# Patient Record
Sex: Female | Born: 1990 | Race: White | Hispanic: No | Marital: Married | Smoking: Never smoker
Health system: Southern US, Community
[De-identification: ages and names within clinical notes are randomized; demographics above are authoritative.]

## PROBLEM LIST (undated history)

## (undated) DIAGNOSIS — F32A Depression, unspecified: Secondary | ICD-10-CM

## (undated) HISTORY — DX: Depression, unspecified: F32.A

---
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
## 2019-12-13 NOTE — L&D Delivery Note (Signed)
OB/GYN Faculty Practice Delivery Note  Paige Michael is a 29 y.o. [id number] s/p spontaneous vaginal delivery at [redacted]w[redacted]d. She was admitted for SROM.   ROM: 12h 56m with clear fluid GBS Status: Negative Maximum Maternal Temperature: 97.9 F   Labor Progress: . Presented to MAU for admission s/p SROM at 0200. Patient desired a waterbirth and entered the tub at 0900 until 1330. Patient encouraged to get out of the tub to void. Patient desired to labor on the bed for a while.  Peanut ball placed for 35 minutes. Patient progressed to complete dilatation without augmentation. Labor progress unremarkable.  Delivery Date/Time: 11/30/2020 at 1408 Delivery: Called to room and patient was complete and pushing. Head delivered ROA. No nuchal cord present. Shoulder and body delivered in usual fashion. Infant with spontaneous cry, placed on mother's abdomen, dried and stimulated. Cord clamped x 2 after 1-minute delay, and cut by patient. Cord blood drawn. Placenta delivered spontaneously, intact, with 3-vessel cord. Fundus firm with massage. Labia, perineum, vagina, and cervix inspected, no laceration found.   Placenta: spontaneous, intact, 3VC at 7034 Complications: none Lacerations: none EBL: 50 mL Analgesia: none  Postpartum Planning [x]  transfer orders to MB [x]  discharge summary started & shared [x]  message to sent to schedule follow-up  [x]  lists updated [x]  vaccines UTD  Infant: girl  APGARs 6/8  [place], [place] for [place] 11/30/2020, 2:26 PM

## 2020-04-16 LAB — OB RESULTS CONSOLE GC/CHLAMYDIA
Chlamydia: NEGATIVE
Gonorrhea: NEGATIVE

## 2020-04-16 LAB — CYTOLOGY - PAP: Pap: NEGATIVE

## 2020-05-14 LAB — OB RESULTS CONSOLE RUBELLA ANTIBODY, IGM: Rubella: IMMUNE

## 2020-05-14 LAB — OB RESULTS CONSOLE HGB/HCT, BLOOD
HCT: 38 (ref 29–41)
Hemoglobin: 12.4

## 2020-05-14 LAB — OB RESULTS CONSOLE HIV ANTIBODY (ROUTINE TESTING): HIV: NONREACTIVE

## 2020-05-14 LAB — OB RESULTS CONSOLE HEPATITIS B SURFACE ANTIGEN: Hepatitis B Surface Ag: NEGATIVE

## 2020-05-14 LAB — OB RESULTS CONSOLE ANTIBODY SCREEN: Antibody Screen: NEGATIVE

## 2020-05-14 LAB — OB RESULTS CONSOLE RPR: RPR: NONREACTIVE

## 2020-05-14 LAB — HEMOGLOBIN A1C: Hemoglobin A1C: 4.9

## 2020-05-14 LAB — OB RESULTS CONSOLE ABO/RH: RH Type: POSITIVE

## 2020-05-14 LAB — OB RESULTS CONSOLE PLATELET COUNT: Platelets: 260

## 2020-09-11 LAB — OB RESULTS CONSOLE HGB/HCT, BLOOD
HCT: 31 (ref 29–41)
Hemoglobin: 10

## 2020-09-11 LAB — OB RESULTS CONSOLE RPR: RPR: NONREACTIVE

## 2020-09-11 LAB — OB RESULTS CONSOLE PLATELET COUNT: Platelets: 286

## 2020-09-11 LAB — GLUCOSE TOLERANCE, 1 HOUR: Glucose, 1 Hour GTT: 127

## 2020-09-14 ENCOUNTER — Inpatient Hospital Stay (HOSPITAL_COMMUNITY): Admit: 2020-09-14 | Payer: No Typology Code available for payment source | Admitting: Obstetrics

## 2020-10-14 ENCOUNTER — Encounter: Payer: Self-pay | Admitting: General Practice

## 2020-10-22 ENCOUNTER — Encounter: Payer: Self-pay | Admitting: Obstetrics and Gynecology

## 2020-10-22 ENCOUNTER — Other Ambulatory Visit: Payer: Self-pay

## 2020-10-22 ENCOUNTER — Ambulatory Visit (INDEPENDENT_AMBULATORY_CARE_PROVIDER_SITE_OTHER): Payer: No Typology Code available for payment source | Admitting: Obstetrics and Gynecology

## 2020-10-22 DIAGNOSIS — Z3480 Encounter for supervision of other normal pregnancy, unspecified trimester: Secondary | ICD-10-CM | POA: Insufficient documentation

## 2020-10-22 HISTORY — DX: Encounter for supervision of other normal pregnancy, unspecified trimester: Z34.80

## 2020-10-22 NOTE — Patient Instructions (Signed)
Considering Waterbirth? Guide for patients at [facility] Why consider waterbirth? . Gentle birth for babies  . Less pain medicine used in labor  . May allow for passive descent/less pushing  . May reduce perineal tears  . More mobility and instinctive maternal position changes  . Increased maternal relaxation  . Reduced blood pressure in labor   Is waterbirth safe? What are the risks of infection, drowning or other complications? . Infection:  Marland Kitchen Very low risk (3.7 % for tub vs 4.8% for bed)  . 7 in 65 waterbirths with documented infection  . Poorly cleaned equipment most common cause  . Slightly lower group B strep transmission rate  . Drowning  . Maternal:  . Very low risk  . Related to seizures or fainting  . Newborn:  Marland Kitchen Very low risk. No evidence of increased risk of respiratory problems in multiple large studies  . Physiological protection from breathing under water  . Avoid underwater birth if there are any fetal complications  . Once baby's head is out of the water, keep it out.  . Birth complication  . Some reports of cord trauma, but risk decreased by bringing baby to surface gradually  . No evidence of increased risk of shoulder dystocia. Mothers can usually change positions faster in water than in a bed, possibly aiding the maneuvers to free the shoulder.  ? You must attend a Waterbirth class at [facility] . 3rd Wednesday of every month from 7-9pm  . Free  . Register by calling [phone] or online at [email or web address]  . Bring [place] the certificate from the class to your prenatal appointment  Meet with a midwife at 36 weeks to see if you can still plan a waterbirth and to sign the consent.   If you plan a waterbirth at [facility], the following purchases are optional: . [place] . Bathing suit top  . Long-handled mirror  .  Things that would prevent you from having a  waterbirth: . Unknown or Positive COVID-19 diagnosis upon admission to hospital  . Premature, <37wks  . Previous cesarean birth  . Presence of thick meconium-stained fluid  . Multiple gestation (Twins, triplets, etc.)  . Uncontrolled diabetes or gestational diabetes requiring medication  . Hypertension diagnosed in pregnancy or preexisting hypertension (gestational hypertension, preeclampsia, or chronic hypertension) . Heavy vaginal bleeding  . Non-reassuring fetal heart rate  . Active infection (MRSA, etc.). Group B Strep is NOT a contraindication for waterbirth.  . If your labor has to be induced and induction method requires continuous monitoring of the baby's heart rate  . Other risks/issues identified by your obstetrical provider  .  Please remember that birth is unpredictable. Under certain unforeseeable circumstances your provider may advise against giving birth in the tub. These decisions will be made on a case-by-case basis and with the safety of you and your baby as our highest priority.  **Please remember that in order to have a waterbirth, you must test Negative to COVID-19 upon admission to the hospital.**

## 2020-10-22 NOTE — Progress Notes (Signed)
   PRENATAL VISIT NOTE  Subjective:  Janissa Bertram is a 29 y.o. G3P2002 at [redacted]w[redacted]d being seen today for ongoing prenatal care. Patient is transferring care from [facility] with records. Patient reports uncomplicated 2 previous vaginal deliveries with minimal interventions. She transferred care to our group with the hope of having a similar experience.  She is currently monitored for the following issues for this low-risk pregnancy and has Supervision of other normal pregnancy, antepartum on their problem list.  Patient reports no complaints.  Contractions: Irritability. Vag. Bleeding: None.  Movement: Present. Denies leaking of fluid.   The following portions of the patient's history were reviewed and updated as appropriate: allergies, current medications, past family history, past medical history, past social history, past surgical history and problem list.   Objective:   Vitals:   10/22/20 1443  BP: 104/67  Pulse: 89    Fetal Status: Fetal Heart Rate (bpm): 140 Fundal Height: 34 cm Movement: Present     General:  Alert, oriented and cooperative. Patient is in no acute distress.  Skin: Skin is warm and dry. No rash noted.   Cardiovascular: Normal heart rate noted  Respiratory: Normal respiratory effort, no problems with respiration noted  Abdomen: Soft, gravid, appropriate for gestational age.  Pain/Pressure: Present     Pelvic: Cervical exam deferred        Extremities: Normal range of motion.  Edema: None  Mental Status: Normal mood and affect. Normal behavior. Normal judgment and thought content.   Assessment and Plan:  Pregnancy: G3P2002 at [redacted]w[redacted]d 1. Supervision of other normal pregnancy, antepartum Patient is doing well without complaints Patient is very much interested in waterbirth and transferred care from [facility] next visit  Midwife visit  Preterm labor symptoms and general obstetric precautions including but not limited to vaginal bleeding,  contractions, leaking of fluid and fetal movement were reviewed in detail with the patient. Please refer to After Visit Summary for other counseling recommendations.   Return in about 2 weeks (around 11/05/2020) for in person, ROB/GBS, Low risk- with a midwife for waterbirth.  No future appointments.  Mora Bellman, MD

## 2020-11-03 ENCOUNTER — Encounter: Payer: Self-pay | Admitting: Advanced Practice Midwife

## 2020-11-03 ENCOUNTER — Other Ambulatory Visit (HOSPITAL_COMMUNITY)
Admission: RE | Admit: 2020-11-03 | Discharge: 2020-11-03 | Disposition: A | Discharge status: Home / Self Care | Payer: No Typology Code available for payment source | Source: Ambulatory Visit | Attending: Advanced Practice Midwife | Admitting: Advanced Practice Midwife

## 2020-11-03 ENCOUNTER — Ambulatory Visit (INDEPENDENT_AMBULATORY_CARE_PROVIDER_SITE_OTHER): Payer: No Typology Code available for payment source | Admitting: Advanced Practice Midwife

## 2020-11-03 ENCOUNTER — Other Ambulatory Visit: Payer: Self-pay

## 2020-11-03 VITALS — BP 140/80 | HR 88 | Wt 203.9 lb

## 2020-11-03 DIAGNOSIS — Z3A36 36 weeks gestation of pregnancy: Secondary | ICD-10-CM

## 2020-11-03 DIAGNOSIS — Z3480 Encounter for supervision of other normal pregnancy, unspecified trimester: Secondary | ICD-10-CM

## 2020-11-03 NOTE — Progress Notes (Signed)
° °  PRENATAL VISIT NOTE  Subjective:  Paige Michael is a 29 y.o. G3P2002 at [redacted]w[redacted]d being seen today for ongoing prenatal care.  She is currently monitored for the following issues for this low-risk pregnancy and has Supervision of other normal pregnancy, antepartum on their problem list.  Patient reports no complaints.  Contractions: Irritability. Vag. Bleeding: None.  Movement: Present. Denies leaking of fluid.   The following portions of the patient's history were reviewed and updated as appropriate: allergies, current medications, past family history, past medical history, past social history, past surgical history and problem list.   Objective:   Vitals:   11/03/20 1548  BP: 140/80  Pulse: 88  Weight: 203 lb 14.4 oz (92.5 kg)    Fetal Status: Fetal Heart Rate (bpm): 141 Fundal Height: 36 cm Movement: Present     General:  Alert, oriented and cooperative. Patient is in no acute distress.  Skin: Skin is warm and dry. No rash noted.   Cardiovascular: Normal heart rate noted  Respiratory: Normal respiratory effort, no problems with respiration noted  Abdomen: Soft, gravid, appropriate for gestational age.  Pain/Pressure: Absent     Pelvic: Cervical exam performed in the presence of a chaperone Dilation: Closed Effacement (%): Thick Station: Ballotable  Extremities: Normal range of motion.  Edema: None  Mental Status: Normal mood and affect. Normal behavior. Normal judgment and thought content.   Assessment and Plan:  Pregnancy: G3P2002 at [redacted]w[redacted]d 1. Supervision of other normal pregnancy, antepartum - GC/Chlamydia probe amp ([place])not at [facility] - Culture, beta strep (group b only)  2. [redacted] weeks gestation of pregnancy - Planning water birth - Has class scheduled for 11/11/2020 - Has gone post-dates with prior pregnancies   Term labor symptoms and general obstetric precautions including but not limited to vaginal bleeding, contractions, leaking of fluid and fetal movement were  reviewed in detail with the patient. Please refer to After Visit Summary for other counseling recommendations.   Return in about 1 week (around 11/10/2020).  No future appointments.  Marcille Buffy DNP, CNM  11/03/20  4:27 PM

## 2020-11-03 NOTE — Patient Instructions (Signed)
Considering Waterbirth? Guide for patients at [facility]) Why consider waterbirth? . Gentle birth for babies  . Less pain medicine used in labor  . May allow for passive descent/less pushing  . May reduce perineal tears  . More mobility and instinctive maternal position changes  . Increased maternal relaxation   Is waterbirth safe? What are the risks of infection, drowning or other complications? . Infection:  Marland Kitchen Very low risk (3.7 % for tub vs 4.8% for bed)  . 7 in 27 waterbirths with documented infection  . Poorly cleaned equipment most common cause  . Slightly lower group B strep transmission rate  . Drowning  . Maternal:  . Very low risk  . Related to seizures or fainting  . Newborn:  Marland Kitchen Very low risk. No evidence of increased risk of respiratory problems in multiple large studies  . Physiological protection from breathing under water  . Avoid underwater birth if there are any fetal complications  . Once baby's head is out of the water, keep it out.  . Birth complication  . Some reports of cord trauma, but risk decreased by bringing baby to surface gradually  . No evidence of increased risk of shoulder dystocia. Mothers can usually change positions faster in water than in a bed, possibly aiding the maneuvers to free the shoulder.   There are 2 things you MUST do to have a waterbirth with [facility]: 1. Attend a waterbirth class at [place] [facility]   a. 3rd Wednesday of every month from 7-9 pm (virtual during [place]) b. Free c. Register by calling [phone] or register online at [email or web address] d. Bring [place] the certificate from the class to your prenatal appointment or send via MyChart 2. Meet with a midwife at 36 weeks* to see if you can still plan a waterbirth and to sign the consent.   *We also recommend that you schedule as many of your prenatal visits with a midwife as possible.    Helpful information: . You may  want to bring a bathing suit top to the hospital to wear during labor but this is optional.  All other supplies are provided by the hospital. . Please arrive at the hospital with signs of active labor, and do not wait at home until late in labor. It takes 45 min- 2 hours for COVID testing, fetal monitoring, and check in to your room to take place, plus transport and filling of the waterbirth tub.    Things that would prevent you from having a waterbirth: . Unknown or Positive COVID-19 diagnosis upon admission to hospital* . Premature, <37wks  . Previous cesarean birth  . Presence of thick meconium-stained fluid  . Multiple gestation (Twins, triplets, etc.)  . Uncontrolled diabetes or gestational diabetes requiring medication  . Hypertension diagnosed in pregnancy or preexisting hypertension (gestational hypertension, preeclampsia, or chronic hypertension) . Heavy vaginal bleeding  . Non-reassuring fetal heart rate  . Active infection (MRSA, etc.). Group B Strep is NOT a contraindication for waterbirth.  . If your labor has to be induced and induction method requires continuous monitoring of the baby's heart rate  . Other risks/issues identified by your obstetrical provider   Please remember that birth is unpredictable. Under certain unforeseeable circumstances your provider may advise against giving birth in the tub. These decisions will be made on a case-by-case basis and with the safety of you and your baby as our highest priority.   *Please remember that in order  to have a waterbirth, you must test Negative to COVID-19 upon admission to the hospital.  Updated 10/27/2020

## 2020-11-04 LAB — GC/CHLAMYDIA PROBE AMP (~~LOC~~) NOT AT ARMC
Chlamydia: NEGATIVE
Comment: NEGATIVE
Comment: NORMAL
Neisseria Gonorrhea: NEGATIVE

## 2020-11-07 LAB — CULTURE, BETA STREP (GROUP B ONLY): Strep Gp B Culture: NEGATIVE

## 2020-11-10 ENCOUNTER — Other Ambulatory Visit: Payer: Self-pay

## 2020-11-10 ENCOUNTER — Ambulatory Visit (INDEPENDENT_AMBULATORY_CARE_PROVIDER_SITE_OTHER): Payer: No Typology Code available for payment source | Admitting: Family Medicine

## 2020-11-10 ENCOUNTER — Encounter: Payer: Self-pay | Admitting: Family Medicine

## 2020-11-10 DIAGNOSIS — Z3480 Encounter for supervision of other normal pregnancy, unspecified trimester: Secondary | ICD-10-CM

## 2020-11-10 NOTE — Progress Notes (Signed)
   Subjective:  Paige Michael is a 29 y.o. G3P2002 at [redacted]w[redacted]d being seen today for ongoing prenatal care.  She is currently monitored for the following issues for this low-risk pregnancy and has Supervision of other normal pregnancy, antepartum on their problem list.  Patient reports mild congestion.  Contractions: Irritability. Vag. Bleeding: None.  Movement: Present. Denies leaking of fluid.   The following portions of the patient's history were reviewed and updated as appropriate: allergies, current medications, past family history, past medical history, past social history, past surgical history and problem list. Problem list updated.  Objective:   Vitals:   11/10/20 0909 11/10/20 0914  BP: 121/79   Pulse: (!) 124   Weight: 204 lb 8 oz (92.8 kg)   Height:  5' 3.5" (1.613 m)    Fetal Status: Fetal Heart Rate (bpm): 142   Movement: Present     General:  Alert, oriented and cooperative. Patient is in no acute distress.  Skin: Skin is warm and dry. No rash noted.   Cardiovascular: Normal heart rate noted  Respiratory: Normal respiratory effort, no problems with respiration noted  Abdomen: Soft, gravid, appropriate for gestational age. Pain/Pressure: Present     Pelvic: Vag. Bleeding: None     Cervical exam deferred        Extremities: Normal range of motion.  Edema: Trace  Mental Status: Normal mood and affect. Normal behavior. Normal judgment and thought content.   Urinalysis:      Assessment and Plan:  Pregnancy: G3P2002 at [redacted]w[redacted]d  1. 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 Please refer to After Visit Summary for other counseling recommendations.  Return in 1 week (on 11/17/2020) for [facility], ob visit.   Clarnce Flock, MD

## 2020-11-10 NOTE — Patient Instructions (Signed)
Third Trimester of Pregnancy The third trimester is from week 28 through week 40 (months 7 through 9). The third trimester is a time when the unborn baby (fetus) is growing rapidly. At the end of the ninth month, the fetus is about 20 inches in length and weighs 6-10 pounds. Body changes during your third trimester Your body will continue to go through many changes during pregnancy. The changes vary from woman to woman. During the third trimester:  Your weight will continue to increase. You can expect to gain 25-35 pounds (11-16 kg) by the end of the pregnancy.  You may begin to get stretch marks on your hips, abdomen, and breasts.  You may urinate more often because the fetus is moving lower into your pelvis and pressing on your bladder.  You may develop or continue to have heartburn. This is caused by increased hormones that slow down muscles in the digestive tract.  You may develop or continue to have constipation because increased hormones slow digestion and cause the muscles that push waste through your intestines to relax.  You may develop hemorrhoids. These are swollen veins (varicose veins) in the rectum that can itch or be painful.  You may develop swollen, bulging veins (varicose veins) in your legs.  You may have increased body aches in the pelvis, back, or thighs. This is due to weight gain and increased hormones that are relaxing your joints.  You may have changes in your hair. These can include thickening of your hair, rapid growth, and changes in texture. Some women also have hair loss during or after pregnancy, or hair that feels dry or thin. Your hair will most likely return to normal after your baby is born.  Your breasts will continue to grow and they will continue to become tender. A yellow fluid (colostrum) may leak from your breasts. This is the first milk you are producing for your baby.  Your belly button may stick out.  You may notice more swelling in your  hands, face, or ankles.  You may have increased tingling or numbness in your hands, arms, and legs. The skin on your belly may also feel numb.  You may feel short of breath because of your expanding uterus.  You may have more problems sleeping. This can be caused by the size of your belly, increased need to urinate, and an increase in your body's metabolism.  You may notice the fetus "dropping," or moving lower in your abdomen (lightening).  You may have increased vaginal discharge.  You may notice your joints feel loose and you may have pain around your pelvic bone. What to expect at prenatal visits You will have prenatal exams every 2 weeks until week 36. Then you will have weekly prenatal exams. During a routine prenatal visit:  You will be weighed to make sure you and the baby are growing normally.  Your blood pressure will be taken.  Your abdomen will be measured to track your baby's growth.  The fetal heartbeat will be listened to.  Any test results from the previous visit will be discussed.  You may have a cervical check near your due date to see if your cervix has softened or thinned (effaced).  You will be tested for Group B streptococcus. This happens between 35 and 37 weeks. Your health care provider may ask you:  What your birth plan is.  How you are feeling.  If you are feeling the baby move.  If you have had any  abnormal symptoms, such as leaking fluid, bleeding, severe headaches, or abdominal cramping.  If you are using any tobacco products, including cigarettes, chewing tobacco, and electronic cigarettes.  If you have any questions. Other tests or screenings that may be performed during your third trimester include:  Blood tests that check for low iron levels (anemia).  Fetal testing to check the health, activity level, and growth of the fetus. Testing is done if you have certain medical conditions or if there are problems during the  pregnancy.  Nonstress test (NST). This test checks the health of your baby to make sure there are no signs of problems, such as the baby not getting enough oxygen. During this test, a belt is placed around your belly. The baby is made to move, and its heart rate is monitored during movement. What is false labor? False labor is a condition in which you feel small, irregular tightenings of the muscles in the womb (contractions) that usually go away with rest, changing position, or drinking water. These are called Braxton Hicks contractions. Contractions may last for hours, days, or even weeks before true labor sets in. If contractions come at regular intervals, become more frequent, increase in intensity, or become painful, you should see your health care provider. What are the signs of labor?  Abdominal cramps.  Regular contractions that start at 10 minutes apart and become stronger and more frequent with time.  Contractions that start on the top of the uterus and spread down to the lower abdomen and back.  Increased pelvic pressure and dull back pain.  A watery or bloody mucus discharge that comes from the vagina.  Leaking of amniotic fluid. This is also known as your "water breaking." It could be a slow trickle or a gush. Let your health care provider know if it has a color or strange odor. If you have any of these signs, call your health care provider right away, even if it is before your due date. Follow these instructions at home: Medicines  Follow your health care provider's instructions regarding medicine use. Specific medicines may be either safe or unsafe to take during pregnancy.  Take a prenatal vitamin that contains at least 600 micrograms (mcg) of folic acid.  If you develop constipation, try taking a stool softener if your health care provider approves. Eating and drinking   Eat a balanced diet that includes fresh fruits and vegetables, whole grains, good sources of protein  such as meat, eggs, or tofu, and low-fat dairy. Your health care provider will help you determine the amount of weight gain that is right for you.  Avoid raw meat and uncooked cheese. These carry germs that can cause birth defects in the baby.  If you have low calcium intake from food, talk to your health care provider about whether you should take a daily calcium supplement.  Eat four or five small meals rather than three large meals a day.  Limit foods that are high in fat and processed sugars, such as fried and sweet foods.  To prevent constipation: ? Drink enough fluid to keep your urine clear or pale yellow. ? Eat foods that are high in fiber, such as fresh fruits and vegetables, whole grains, and beans. Activity  Exercise only as directed by your health care provider. Most women can continue their usual exercise routine during pregnancy. Try to exercise for 30 minutes at least 5 days a week. Stop exercising if you experience uterine contractions.  Avoid heavy lifting.  Do not exercise in extreme heat or humidity, or at high altitudes.  Wear low-heel, comfortable shoes.  Practice good posture.  You may continue to have sex unless your health care provider tells you otherwise. Relieving pain and discomfort  Take frequent breaks and rest with your legs elevated if you have leg cramps or low back pain.  Take warm sitz baths to soothe any pain or discomfort caused by hemorrhoids. Use hemorrhoid cream if your health care provider approves.  Wear a good support bra to prevent discomfort from breast tenderness.  If you develop varicose veins: ? Wear support pantyhose or compression stockings as told by your healthcare provider. ? Elevate your feet for 15 minutes, 3-4 times a day. Prenatal care  Write down your questions. Take them to your prenatal visits.  Keep all your prenatal visits as told by your health care provider. This is important. Safety  Wear your seat belt at  all times when driving.  Make a list of emergency phone numbers, including numbers for family, friends, the hospital, and police and fire departments. General instructions  Avoid cat litter boxes and soil used by cats. These carry germs that can cause birth defects in the baby. If you have a cat, ask someone to clean the litter box for you.  Do not travel far distances unless it is absolutely necessary and only with the approval of your health care provider.  Do not use hot tubs, steam rooms, or saunas.  Do not drink alcohol.  Do not use any products that contain nicotine or tobacco, such as cigarettes and e-cigarettes. If you need help quitting, ask your health care provider.  Do not use any medicinal herbs or unprescribed drugs. These chemicals affect the formation and growth of the baby.  Do not douche or use tampons or scented sanitary pads.  Do not cross your legs for long periods of time.  To prepare for the arrival of your baby: ? Take prenatal classes to understand, practice, and ask questions about labor and delivery. ? Make a trial run to the hospital. ? Visit the hospital and tour the maternity area. ? Arrange for maternity or paternity leave through employers. ? Arrange for family and friends to take care of pets while you are in the hospital. ? Purchase a rear-facing car seat and make sure you know how to install it in your car. ? Pack your hospital bag. ? Prepare the baby's nursery. Make sure to remove all pillows and stuffed animals from the baby's crib to prevent suffocation.  Visit your dentist if you have not gone during your pregnancy. Use a soft toothbrush to brush your teeth and be gentle when you floss. Contact a health care provider if:  You are unsure if you are in labor or if your water has broken.  You become dizzy.  You have mild pelvic cramps, pelvic pressure, or nagging pain in your abdominal area.  You have lower back pain.  You have persistent  nausea, vomiting, or diarrhea.  You have an unusual or bad smelling vaginal discharge.  You have pain when you urinate. Get help right away if:  Your water breaks before 37 weeks.  You have regular contractions less than 5 minutes apart before 37 weeks.  You have a fever.  You are leaking fluid from your vagina.  You have spotting or bleeding from your vagina.  You have severe abdominal pain or cramping.  You have rapid weight loss or weight gain.  You  have shortness of breath with chest pain.  You notice sudden or extreme swelling of your face, hands, ankles, feet, or legs.  Your baby makes fewer than 10 movements in 2 hours.  You have severe headaches that do not go away when you take medicine.  You have vision changes. Summary  The third trimester is from week 28 through week 40, months 7 through 9. The third trimester is a time when the unborn baby (fetus) is growing rapidly.  During the third trimester, your discomfort may increase as you and your baby continue to gain weight. You may have abdominal, leg, and back pain, sleeping problems, and an increased need to urinate.  During the third trimester your breasts will keep growing and they will continue to become tender. A yellow fluid (colostrum) may leak from your breasts. This is the first milk you are producing for your baby.  False labor is a condition in which you feel small, irregular tightenings of the muscles in the womb (contractions) that eventually go away. These are called Braxton Hicks contractions. Contractions may last for hours, days, or even weeks before true labor sets in.  Signs of labor can include: abdominal cramps; regular contractions that start at 10 minutes apart and become stronger and more frequent with time; watery or bloody mucus discharge that comes from the vagina; increased pelvic pressure and dull back pain; and leaking of amniotic fluid. This information is not intended to replace advice  given to you by your health care provider. Make sure you discuss any questions you have with your health care provider. Document Revised: 03/21/2019 Document Reviewed: 01/03/2017 Elsevier Patient Education  2020 [employer].   Contraception Choices Contraception, also called birth control, refers to methods or devices that prevent pregnancy. Hormonal methods Contraceptive implant  A contraceptive implant is a thin, plastic tube that contains a hormone. It is inserted into the upper part of the arm. It can remain in place for up to 3 years. Progestin-only injections Progestin-only injections are injections of progestin, a synthetic form of the hormone progesterone. They are given every 3 months by a health care provider. Birth control pills  Birth control pills are pills that contain hormones that prevent pregnancy. They must be taken once a day, preferably at the same time each day. Birth control patch  The birth control patch contains hormones that prevent pregnancy. It is placed on the skin and must be changed once a week for three weeks and removed on the fourth week. A prescription is needed to use this method of contraception. Vaginal ring  A vaginal ring contains hormones that prevent pregnancy. It is placed in the vagina for three weeks and removed on the fourth week. After that, the process is repeated with a new ring. A prescription is needed to use this method of contraception. Emergency contraceptive Emergency contraceptives prevent pregnancy after unprotected sex. They come in pill form and can be taken up to 5 days after sex. They work best the sooner they are taken after having sex. Most emergency contraceptives are available without a prescription. This method should not be used as your only form of birth control. Barrier methods Female condom  A female condom is a thin sheath that is worn over the penis during sex. Condoms keep sperm from going inside a woman's body. They can  be used with a spermicide to increase their effectiveness. They should be disposed after a single use. Female condom  A female condom is a soft,  loose-fitting sheath that is put into the vagina before sex. The condom keeps sperm from going inside a woman's body. They should be disposed after a single use. Diaphragm  A diaphragm is a soft, dome-shaped barrier. It is inserted into the vagina before sex, along with a spermicide. The diaphragm blocks sperm from entering the uterus, and the spermicide kills sperm. A diaphragm should be left in the vagina for 6-8 hours after sex and removed within 24 hours. A diaphragm is prescribed and fitted by a health care provider. A diaphragm should be replaced every 1-2 years, after giving birth, after gaining more than 15 lb (6.8 kg), and after pelvic surgery. Cervical cap  A cervical cap is a round, soft latex or plastic cup that fits over the cervix. It is inserted into the vagina before sex, along with spermicide. It blocks sperm from entering the uterus. The cap should be left in place for 6-8 hours after sex and removed within 48 hours. A cervical cap must be prescribed and fitted by a health care provider. It should be replaced every 2 years. Sponge  A sponge is a soft, circular piece of polyurethane foam with spermicide on it. The sponge helps block sperm from entering the uterus, and the spermicide kills sperm. To use it, you make it wet and then insert it into the vagina. It should be inserted before sex, left in for at least 6 hours after sex, and removed and thrown away within 30 hours. Spermicides Spermicides are chemicals that kill or block sperm from entering the cervix and uterus. They can come as a cream, jelly, suppository, foam, or tablet. A spermicide should be inserted into the vagina with an applicator at least 41-93 minutes before sex to allow time for it to work. The process must be repeated every time you have sex. Spermicides do not require  a prescription. Intrauterine contraception Intrauterine device (IUD) An IUD is a T-shaped device that is put in a woman's uterus. There are two types:  Hormone IUD.This type contains progestin, a synthetic form of the hormone progesterone. This type can stay in place for 3-5 years.  Copper IUD.This type is wrapped in copper wire. It can stay in place for 10 years.  Permanent methods of contraception Female tubal ligation In this method, a woman's fallopian tubes are sealed, tied, or blocked during surgery to prevent eggs from traveling to the uterus. Hysteroscopic sterilization In this method, a small, flexible insert is placed into each fallopian tube. The inserts cause scar tissue to form in the fallopian tubes and block them, so sperm cannot reach an egg. The procedure takes about 3 months to be effective. Another form of birth control must be used during those 3 months. Female sterilization This is a procedure to tie off the tubes that carry sperm (vasectomy). After the procedure, the man can still ejaculate fluid (semen). Natural planning methods Natural family planning In this method, a couple does not have sex on days when the woman could become pregnant. Calendar method This means keeping track of the length of each menstrual cycle, identifying the days when pregnancy can happen, and not having sex on those days. Ovulation method In this method, a couple avoids sex during ovulation. Symptothermal method This method involves not having sex during ovulation. The woman typically checks for ovulation by watching changes in her temperature and in the consistency of cervical mucus. Post-ovulation method In this method, a couple waits to have sex until after ovulation. Summary  Contraception, also called birth control, means methods or devices that prevent pregnancy.  Hormonal methods of contraception include implants, injections, pills, patches, vaginal rings, and emergency  contraceptives.  Barrier methods of contraception can include female condoms, female condoms, diaphragms, cervical caps, sponges, and spermicides.  There are two types of IUDs (intrauterine devices). An IUD can be put in a woman's uterus to prevent pregnancy for 3-5 years.  Permanent sterilization can be done through a procedure for males, females, or both.  Natural family planning methods involve not having sex on days when the woman could become pregnant. This information is not intended to replace advice given to you by your health care provider. Make sure you discuss any questions you have with your health care provider. Document Revised: 11/30/2017 Document Reviewed: 12/31/2016 Elsevier Patient Education  2020 [employer].   Breastfeeding  Choosing to breastfeed is one of the best decisions you can make for yourself and your baby. A change in hormones during pregnancy causes your breasts to make breast milk in your milk-producing glands. Hormones prevent breast milk from being released before your baby is born. They also prompt milk flow after birth. Once breastfeeding has begun, thoughts of your baby, as well as his or her sucking or crying, can stimulate the release of milk from your milk-producing glands. Benefits of breastfeeding Research shows that breastfeeding offers many health benefits for infants and mothers. It also offers a cost-free and convenient way to feed your baby. For your baby  Your first milk (colostrum) helps your baby's digestive system to function better.  Special cells in your milk (antibodies) help your baby to fight off infections.  Breastfed babies are less likely to develop asthma, allergies, obesity, or type 2 diabetes. They are also at lower risk for sudden infant death syndrome (SIDS).  Nutrients in breast milk are better able to meet your baby's needs compared to infant formula.  Breast milk improves your baby's brain development. For  you  Breastfeeding helps to create a very special bond between you and your baby.  Breastfeeding is convenient. Breast milk costs nothing and is always available at the correct temperature.  Breastfeeding helps to burn calories. It helps you to lose the weight that you gained during pregnancy.  Breastfeeding makes your uterus return faster to its size before pregnancy. It also slows bleeding (lochia) after you give birth.  Breastfeeding helps to lower your risk of developing type 2 diabetes, osteoporosis, rheumatoid arthritis, cardiovascular disease, and breast, ovarian, uterine, and endometrial cancer later in life. Breastfeeding basics Starting breastfeeding  Find a comfortable place to sit or lie down, with your neck and back well-supported.  Place a pillow or a rolled-up blanket under your baby to bring him or her to the level of your breast (if you are seated). Nursing pillows are specially designed to help support your arms and your baby while you breastfeed.  Make sure that your baby's tummy (abdomen) is facing your abdomen.  Gently massage your breast. With your fingertips, massage from the outer edges of your breast inward toward the nipple. This encourages milk flow. If your milk flows slowly, you may need to continue this action during the feeding.  Support your breast with 4 fingers underneath and your thumb above your nipple (make the letter "C" with your hand). Make sure your fingers are well away from your nipple and your baby's mouth.  Stroke your baby's lips gently with your finger or nipple.  When your baby's mouth is open  wide enough, quickly bring your baby to your breast, placing your entire nipple and as much of the areola as possible into your baby's mouth. The areola is the colored area around your nipple. ? More areola should be visible above your baby's upper lip than below the lower lip. ? Your baby's lips should be opened and extended outward (flanged) to  ensure an adequate, comfortable latch. ? Your baby's tongue should be between his or her lower gum and your breast.  Make sure that your baby's mouth is correctly positioned around your nipple (latched). Your baby's lips should create a seal on your breast and be turned out (everted).  It is common for your baby to suck about 2-3 minutes in order to start the flow of breast milk. Latching Teaching your baby how to latch onto your breast properly is very important. An improper latch can cause nipple pain, decreased milk supply, and poor weight gain in your baby. Also, if your baby is not latched onto your nipple properly, he or she may swallow some air during feeding. This can make your baby fussy. Burping your baby when you switch breasts during the feeding can help to get rid of the air. However, teaching your baby to latch on properly is still the best way to prevent fussiness from swallowing air while breastfeeding. Signs that your baby has successfully latched onto your nipple  Silent tugging or silent sucking, without causing you pain. Infant's lips should be extended outward (flanged).  Swallowing heard between every 3-4 sucks once your milk has started to flow (after your let-down milk reflex occurs).  Muscle movement above and in front of his or her ears while sucking. Signs that your baby has not successfully latched onto your nipple  Sucking sounds or smacking sounds from your baby while breastfeeding.  Nipple pain. If you think your baby has not latched on correctly, slip your finger into the corner of your baby's mouth to break the suction and place it between your baby's gums. Attempt to start breastfeeding again. Signs of successful breastfeeding Signs from your baby  Your baby will gradually decrease the number of sucks or will completely stop sucking.  Your baby will fall asleep.  Your baby's body will relax.  Your baby will retain a small amount of milk in his or her  mouth.  Your baby will let go of your breast by himself or herself. Signs from you  Breasts that have increased in firmness, weight, and size 1-3 hours after feeding.  Breasts that are softer immediately after breastfeeding.  Increased milk volume, as well as a change in milk consistency and color by the fifth day of breastfeeding.  Nipples that are not sore, cracked, or bleeding. Signs that your baby is getting enough milk  Wetting at least 1-2 diapers during the first 24 hours after birth.  Wetting at least 5-6 diapers every 24 hours for the first week after birth. The urine should be clear or pale yellow by the age of 5 days.  Wetting 6-8 diapers every 24 hours as your baby continues to grow and develop.  At least 3 stools in a 24-hour period by the age of 5 days. The stool should be soft and yellow.  At least 3 stools in a 24-hour period by the age of 7 days. The stool should be seedy and yellow.  No loss of weight greater than 10% of birth weight during the first 3 days of life.  Average weight gain  of 4-7 oz (113-198 g) per week after the age of 4 days.  Consistent daily weight gain by the age of 5 days, without weight loss after the age of 2 weeks. After a feeding, your baby may spit up a small amount of milk. This is normal. Breastfeeding frequency and duration Frequent feeding will help you make more milk and can prevent sore nipples and extremely full breasts (breast engorgement). Breastfeed when you feel the need to reduce the fullness of your breasts or when your baby shows signs of hunger. This is called "breastfeeding on demand." Signs that your baby is hungry include:  Increased alertness, activity, or restlessness.  Movement of the head from side to side.  Opening of the mouth when the corner of the mouth or cheek is stroked (rooting).  Increased sucking sounds, smacking lips, cooing, sighing, or squeaking.  Hand-to-mouth movements and sucking on fingers or  hands.  Fussing or crying. Avoid introducing a pacifier to your baby in the first 4-6 weeks after your baby is born. After this time, you may choose to use a pacifier. Research has shown that pacifier use during the first year of a baby's life decreases the risk of sudden infant death syndrome (SIDS). Allow your baby to feed on each breast as long as he or she wants. When your baby unlatches or falls asleep while feeding from the first breast, offer the second breast. Because newborns are often sleepy in the first few weeks of life, you may need to awaken your baby to get him or her to feed. Breastfeeding times will vary from baby to baby. However, the following rules can serve as a guide to help you make sure that your baby is properly fed:  Newborns (babies 64 weeks of age or younger) may breastfeed every 1-3 hours.  Newborns should not go without breastfeeding for longer than 3 hours during the day or 5 hours during the night.  You should breastfeed your baby a minimum of 8 times in a 24-hour period. Breast milk pumping     Pumping and storing breast milk allows you to make sure that your baby is exclusively fed your breast milk, even at times when you are unable to breastfeed. This is especially important if you go back to work while you are still breastfeeding, or if you are not able to be present during feedings. Your lactation consultant can help you find a method of pumping that works best for you and give you guidelines about how long it is safe to store breast milk. Caring for your breasts while you breastfeed Nipples can become dry, cracked, and sore while breastfeeding. The following recommendations can help keep your breasts moisturized and healthy:  Avoid using soap on your nipples.  Wear a supportive bra designed especially for nursing. Avoid wearing underwire-style bras or extremely tight bras (sports bras).  Air-dry your nipples for 3-4 minutes after each feeding.  Use only  cotton bra pads to absorb leaked breast milk. Leaking of breast milk between feedings is normal.  Use lanolin on your nipples after breastfeeding. Lanolin helps to maintain your skin's normal moisture barrier. Pure lanolin is not harmful (not toxic) to your baby. You may also hand express a few drops of breast milk and gently massage that milk into your nipples and allow the milk to air-dry. In the first few weeks after giving birth, some women experience breast engorgement. Engorgement can make your breasts feel heavy, warm, and tender to the touch. Engorgement  peaks within 3-5 days after you give birth. The following recommendations can help to ease engorgement:  Completely empty your breasts while breastfeeding or pumping. You may want to start by applying warm, moist heat (in the shower or with warm, water-soaked hand towels) just before feeding or pumping. This increases circulation and helps the milk flow. If your baby does not completely empty your breasts while breastfeeding, pump any extra milk after he or she is finished.  Apply ice packs to your breasts immediately after breastfeeding or pumping, unless this is too uncomfortable for you. To do this: ? Put ice in a plastic bag. ? Place a towel between your skin and the bag. ? Leave the ice on for 20 minutes, 2-3 times a day.  Make sure that your baby is latched on and positioned properly while breastfeeding. If engorgement persists after 48 hours of following these recommendations, contact your health care provider or a Science writer. Overall health care recommendations while breastfeeding  Eat 3 healthy meals and 3 snacks every day. Well-nourished mothers who are breastfeeding need an additional 450-500 calories a day. You can meet this requirement by increasing the amount of a balanced diet that you eat.  Drink enough water to keep your urine pale yellow or clear.  Rest often, relax, and continue to take your prenatal vitamins  to prevent fatigue, stress, and low vitamin and mineral levels in your body (nutrient deficiencies).  Do not use any products that contain nicotine or tobacco, such as cigarettes and e-cigarettes. Your baby may be harmed by chemicals from cigarettes that pass into breast milk and exposure to secondhand smoke. If you need help quitting, ask your health care provider.  Avoid alcohol.  Do not use illegal drugs or marijuana.  Talk with your health care provider before taking any medicines. These include over-the-counter and prescription medicines as well as vitamins and herbal supplements. Some medicines that may be harmful to your baby can pass through breast milk.  It is possible to become pregnant while breastfeeding. If birth control is desired, ask your health care provider about options that will be safe while breastfeeding your baby. Where to find more information: [employer]: www.llli.org Contact a health care provider if:  You feel like you want to stop breastfeeding or have become frustrated with breastfeeding.  Your nipples are cracked or bleeding.  Your breasts are red, tender, or warm.  You have: ? Painful breasts or nipples. ? A swollen area on either breast. ? A fever or chills. ? Nausea or vomiting. ? Drainage other than breast milk from your nipples.  Your breasts do not become full before feedings by the fifth day after you give birth.  You feel sad and depressed.  Your baby is: ? Too sleepy to eat well. ? Having trouble sleeping. ? More than 32 week old and wetting fewer than 6 diapers in a 24-hour period. ? Not gaining weight by 2 days of age.  Your baby has fewer than 3 stools in a 24-hour period.  Your baby's skin or the white parts of his or her eyes become yellow. Get help right away if:  Your baby is overly tired (lethargic) and does not want to wake up and feed.  Your baby develops an unexplained fever. Summary  Breastfeeding  offers many health benefits for infant and mothers.  Try to breastfeed your infant when he or she shows early signs of hunger.  Gently tickle or stroke your baby's lips with  your finger or nipple to allow the baby to open his or her mouth. Bring the baby to your breast. Make sure that much of the areola is in your baby's mouth. Offer one side and burp the baby before you offer the other side.  Talk with your health care provider or lactation consultant if you have questions or you face problems as you breastfeed. This information is not intended to replace advice given to you by your health care provider. Make sure you discuss any questions you have with your health care provider. Document Revised: 02/22/2018 Document Reviewed: 12/30/2016 Elsevier Patient Education  [employer].

## 2020-11-17 ENCOUNTER — Other Ambulatory Visit: Payer: Self-pay

## 2020-11-17 ENCOUNTER — Ambulatory Visit (INDEPENDENT_AMBULATORY_CARE_PROVIDER_SITE_OTHER): Payer: No Typology Code available for payment source | Admitting: Certified Nurse Midwife

## 2020-11-17 VITALS — BP 110/73 | HR 108 | Wt 205.0 lb

## 2020-11-17 DIAGNOSIS — Z3A38 38 weeks gestation of pregnancy: Secondary | ICD-10-CM

## 2020-11-17 DIAGNOSIS — Z3483 Encounter for supervision of other normal pregnancy, third trimester: Secondary | ICD-10-CM

## 2020-11-17 NOTE — Progress Notes (Signed)
   PRENATAL VISIT NOTE  Subjective:  Paige Michael is a 29 y.o. G3P2002 at [redacted]w[redacted]d being seen today for ongoing prenatal care.  She is currently monitored for the following issues for this low-risk pregnancy and has Supervision of other normal pregnancy, antepartum on their problem list.  Patient reports no complaints.  Contractions: Irritability. Vag. Bleeding: None.  Movement: Present. Denies leaking of fluid.   The following portions of the patient's history were reviewed and updated as appropriate: allergies, current medications, past family history, past medical history, past social history, past surgical history and problem list.   Objective:   Vitals:   11/17/20 1026  BP: 110/73  Pulse: (!) 108  Weight: 205 lb (93 kg)    Fetal Status: Fetal Heart Rate (bpm): 138 Fundal Height: 39 cm Movement: Present  Presentation: Vertex  General:  Alert, oriented and cooperative. Patient is in no acute distress.  Skin: Skin is warm and dry. No rash noted.   Cardiovascular: Normal heart rate noted  Respiratory: Normal respiratory effort, no problems with respiration noted  Abdomen: Soft, gravid, appropriate for gestational age.  Pain/Pressure: Present     Pelvic: Cervical exam deferred        Extremities: Normal range of motion.  Edema: Trace  Mental Status: Normal mood and affect. Normal behavior. Normal judgment and thought content.   Assessment and Plan:  Pregnancy: G3P2002 at [redacted]w[redacted]d 1. Encounter for supervision of other normal pregnancy in third trimester - Doing well, no consistent contractions or other complaints  2. [redacted] weeks gestation of pregnancy - Waterbirth consent obtained - Discussed birth plan, pt prefers no IV and would like to avoid prophylactic 3rd stage Pitocin if possible. Pt is anemic, discussed the increased risk of pph associated with anemia, pt taking iron polysaccharides and ok for prophylactic Pitocin if still anemic at admission. All other birth plan desires are  standard practice (delayed cord clamping, etc) - Pt also desires to avoid induction until 42 weeks, last two babies came in her 41st week of pregnancy.  Preterm labor symptoms and general obstetric precautions including but not limited to vaginal bleeding, contractions, leaking of fluid and fetal movement were reviewed in detail with the patient. Please refer to After Visit Summary for other counseling recommendations.   Return in about 1 week (around 11/24/2020) for IN-PERSON, LOB.  Future Appointments  Date Time Provider [place]  11/24/2020  3:55 PM Danielle Rankin [facility]    Gabriel Carina, CNM

## 2020-11-24 ENCOUNTER — Encounter: Payer: Self-pay | Admitting: Family Medicine

## 2020-11-24 ENCOUNTER — Other Ambulatory Visit: Payer: Self-pay

## 2020-11-24 ENCOUNTER — Ambulatory Visit (INDEPENDENT_AMBULATORY_CARE_PROVIDER_SITE_OTHER): Payer: No Typology Code available for payment source | Admitting: Medical

## 2020-11-24 ENCOUNTER — Encounter: Payer: Self-pay | Admitting: Medical

## 2020-11-24 VITALS — BP 129/73 | HR 90 | Wt 206.9 lb

## 2020-11-24 DIAGNOSIS — Z3A39 39 weeks gestation of pregnancy: Secondary | ICD-10-CM

## 2020-11-24 DIAGNOSIS — Z3480 Encounter for supervision of other normal pregnancy, unspecified trimester: Secondary | ICD-10-CM

## 2020-11-24 NOTE — Patient Instructions (Addendum)
Fetal Movement Counts Patient Name: ________________________________________________ Patient Due Date: ____________________ What is a fetal movement count?  A fetal movement count is the number of times that you feel your baby move during a certain amount of time. This may also be called a fetal kick count. A fetal movement count is recommended for every pregnant woman. You may be asked to start counting fetal movements as early as week 28 of your pregnancy. Pay attention to when your baby is most active. You may notice your baby's sleep and wake cycles. You may also notice things that make your baby move more. You should do a fetal movement count:  When your baby is normally most active.  At the same time each day. A good time to count movements is while you are resting, after having something to eat and drink. How do I count fetal movements? 1. Find a quiet, comfortable area. Sit, or lie down on your side. 2. Write down the date, the start time and stop time, and the number of movements that you felt between those two times. Take this information with you to your health care visits. 3. Write down your start time when you feel the first movement. 4. Count kicks, flutters, swishes, rolls, and jabs. You should feel at least 10 movements. 5. You may stop counting after you have felt 10 movements, or if you have been counting for 2 hours. Write down the stop time. 6. If you do not feel 10 movements in 2 hours, contact your health care provider for further instructions. Your health care provider may want to do additional tests to assess your baby's well-being. Contact a health care provider if:  You feel fewer than 10 movements in 2 hours.  Your baby is not moving like he or she usually does. Date: ____________ Start time: ____________ Stop time: ____________ Movements: ____________ Date: ____________ Start time: ____________ Stop time: ____________ Movements: ____________ Date: ____________  Start time: ____________ Stop time: ____________ Movements: ____________ Date: ____________ Start time: ____________ Stop time: ____________ Movements: ____________ Date: ____________ Start time: ____________ Stop time: ____________ Movements: ____________ Date: ____________ Start time: ____________ Stop time: ____________ Movements: ____________ Date: ____________ Start time: ____________ Stop time: ____________ Movements: ____________ Date: ____________ Start time: ____________ Stop time: ____________ Movements: ____________ Date: ____________ Start time: ____________ Stop time: ____________ Movements: ____________ This information is not intended to replace advice given to you by your health care provider. Make sure you discuss any questions you have with your health care provider. Document Revised: 07/18/2019 Document Reviewed: 07/18/2019 Elsevier Patient Education  2020 Else[employer] of the uterus can occur throughout pregnancy, but they are not always a sign that you are in labor. You may have practice contractions called Braxton Hicks contractions. These false labor contractions are sometimes confused with true labor. What are [place] contractions? Braxton Hicks contractions are tightening movements that occur in the muscles of the uterus before labor. Unlike true labor contractions, these contractions do not result in opening (dilation) and thinning of the cervix. Toward the end of pregnancy (32-34 weeks), Braxton Hicks contractions can happen more often and may become stronger. These contractions are sometimes difficult to tell apart from true labor because they can be very uncomfortable. You should not feel embarrassed if you go to the hospital with false labor. Sometimes, the only way to tell if you are in true labor is for your health care provider to look for changes in the cervix. The health care provider  will do a physical exam and may  monitor your contractions. If you are not in true labor, the exam should show that your cervix is not dilating and your water has not broken. If there are no other health problems associated with your pregnancy, it is completely safe for you to be sent home with false labor. You may continue to have Braxton Hicks contractions until you go into true labor. How to tell the difference between true labor and false labor True labor  Contractions last 30-70 seconds.  Contractions become very regular.  Discomfort is usually felt in the top of the uterus, and it spreads to the lower abdomen and low back.  Contractions do not go away with walking.  Contractions usually become more intense and increase in frequency.  The cervix dilates and gets thinner. False labor  Contractions are usually shorter and not as strong as true labor contractions.  Contractions are usually irregular.  Contractions are often felt in the front of the lower abdomen and in the groin.  Contractions may go away when you walk around or change positions while lying down.  Contractions get weaker and are shorter-lasting as time goes on.  The cervix usually does not dilate or become thin. Follow these instructions at home:   Take over-the-counter and prescription medicines only as told by your health care provider.  Keep up with your usual exercises and follow other instructions from your health care provider.  Eat and drink lightly if you think you are going into labor.  If Braxton Hicks contractions are making you uncomfortable: ? Change your position from lying down or resting to walking, or change from walking to resting. ? Sit and rest in a tub of warm water. ? Drink enough fluid to keep your urine pale yellow. Dehydration may cause these contractions. ? Do slow and deep breathing several times an hour.  Keep all follow-up prenatal visits as told by your health care provider. This is important. Contact a  health care provider if:  You have a fever.  You have continuous pain in your abdomen. Get help right away if:  Your contractions become stronger, more regular, and closer together.  You have fluid leaking or gushing from your vagina.  You pass blood-tinged mucus (bloody show).  You have bleeding from your vagina.  You have low back pain that you never had before.  You feel your baby's head pushing down and causing pelvic pressure.  Your baby is not moving inside you as much as it used to. Summary  Contractions that occur before labor are called Braxton Hicks contractions, false labor, or practice contractions.  Braxton Hicks contractions are usually shorter, weaker, farther apart, and less regular than true labor contractions. True labor contractions usually become progressively stronger and regular, and they become more frequent.  Manage discomfort from [facility] contractions by changing position, resting in a warm bath, drinking plenty of water, or practicing deep breathing. This information is not intended to replace advice given to you by your health care provider. Make sure you discuss any questions you have with your health care provider. Document Revised: 11/10/2017 Document Reviewed: 04/13/2017 Elsevier Patient Education  [employer].    Cervical Ripening (to get your cervix ready for labor) : May try one or all:  Red Raspberry Leaf capsules:  two 300mg  or 400mg  tablets with each meal, 2-3 times a day  Potential Side Effects Of Raspberry Leaf:  Most women do not experience any side effects  from drinking raspberry leaf tea. However, nausea and loose stools are possible     Evening Primrose Oil capsules: may take 1 to 3 capsules daily. May also prick one to release the oil and insert it into your vagina at night.  Some of the potential side effects:  Upset stomach  Loose stools or diarrhea  Headaches  Nausea   4 Dates a day (may taste better if  warmed in microwave until soft). Found where raisins are in the grocery store  [place]:  Information Sheet for Mothers and Family               What's a Foley Bulb Induction? A Foley bulb induction is a procedure where your provider inserts a catheter into your cervix. Once inside your womb, your provider inflates the balloon with a saline solution.   This puts pressure on your cervix and encourages dilation. The catheter falls out once your cervix dilates to 3-4 centimeters.     With any procedure, it's important that you know what to expect. The insertion of a Foley catheter can be a bit uncomfortable, and some women experience sharp pelvic pain. The pain may subside once the catheter is in place. You may experience some cramping when the Foley catheter is in place.  This is normal.     GO TO THE MATERNITY ADMISSIONS UNIT FOR THE FOLLOWING:  Heavy vaginal bleeding  Rupture of membranes (fluid that wets your underwear)  Painful uterine contractions every 5 minutes or less  Severe abdominal discomfort  Decreased movement of the baby

## 2020-11-26 ENCOUNTER — Encounter: Payer: Self-pay | Admitting: *Deleted

## 2020-11-26 NOTE — Progress Notes (Signed)
   PRENATAL VISIT NOTE  Subjective:  Paige Michael is a 29 y.o. [id number] at [redacted]w[redacted]d being seen today for ongoing prenatal care.  She is currently monitored for the following issues for this low-risk pregnancy and has Supervision of other normal pregnancy, antepartum on their problem list.  Patient reports occasional contractions.  Contractions: Irritability. Vag. Bleeding: None.  Movement: Present. Denies leaking of fluid.   The following portions of the patient's history were reviewed and updated as appropriate: allergies, current medications, past family history, past medical history, past social history, past surgical history and problem list.   Objective:   Vitals:   11/24/20 1615  BP: 129/73  Pulse: 90  Weight: 206 lb 14.4 oz (93.8 kg)    Fetal Status: Fetal Heart Rate (bpm): 139   Movement: Present  Presentation: Vertex  General:  Alert, oriented and cooperative. Patient is in no acute distress.  Skin: Skin is warm and dry. No rash noted.   Cardiovascular: Normal heart rate noted  Respiratory: Normal respiratory effort, no problems with respiration noted  Abdomen: Soft, gravid, appropriate for gestational age.  Pain/Pressure: Present     Pelvic: Cervical exam performed in the presence of a chaperone Dilation: 1 Effacement (%): Thick Station: Ballotable  Extremities: Normal range of motion.  Edema: Trace  Mental Status: Normal mood and affect. Normal behavior. Normal judgment and thought content.   Assessment and Plan:  Pregnancy: G3P2002 at [redacted]w[redacted]d 1. Supervision of other normal pregnancy, antepartum - Doing well - Few contractions noted over the last week, none today  - Patient did not want to schedule IOL today, planning WB - Discussed possibility of Foley bulb to start outpatient IOL if [redacted]w[redacted]d and not delivered  2. [redacted] weeks gestation of pregnancy  Term labor symptoms and general obstetric precautions including but not limited to vaginal bleeding, contractions, leaking of  fluid and fetal movement were reviewed in detail with the patient. Please refer to After Visit Summary for other counseling recommendations.   Return in about 1 week (around 12/01/2020) for LOB, Midwife preferred, In-Person.  Future Appointments  Date Time Provider [place]  12/01/2020 10:35 AM Tresea Mall, CNM [facility]    Kerry Hough, PA-C

## 2020-11-30 ENCOUNTER — Other Ambulatory Visit: Payer: Self-pay

## 2020-11-30 ENCOUNTER — Inpatient Hospital Stay (HOSPITAL_COMMUNITY)
Admission: AD | Admit: 2020-11-30 | Discharge: 2020-12-01 | DRG: 807 | Disposition: A | Discharge status: Home / Self Care | Payer: No Typology Code available for payment source | Attending: Obstetrics & Gynecology | Admitting: Obstetrics & Gynecology

## 2020-11-30 ENCOUNTER — Encounter (HOSPITAL_COMMUNITY): Payer: Self-pay | Admitting: Obstetrics & Gynecology

## 2020-11-30 DIAGNOSIS — Z20822 Contact with and (suspected) exposure to covid-19: Secondary | ICD-10-CM | POA: Diagnosis present

## 2020-11-30 DIAGNOSIS — Z3A40 40 weeks gestation of pregnancy: Secondary | ICD-10-CM

## 2020-11-30 DIAGNOSIS — O26893 Other specified pregnancy related conditions, third trimester: Secondary | ICD-10-CM | POA: Diagnosis present

## 2020-11-30 DIAGNOSIS — O4292 Full-term premature rupture of membranes, unspecified as to length of time between rupture and onset of labor: Secondary | ICD-10-CM | POA: Diagnosis present

## 2020-11-30 DIAGNOSIS — O4290 Premature rupture of membranes, unspecified as to length of time between rupture and onset of labor, unspecified weeks of gestation: Secondary | ICD-10-CM | POA: Diagnosis present

## 2020-11-30 DIAGNOSIS — Z3480 Encounter for supervision of other normal pregnancy, unspecified trimester: Secondary | ICD-10-CM

## 2020-11-30 HISTORY — DX: Premature rupture of membranes, unspecified as to length of time between rupture and onset of labor, unspecified weeks of gestation: O42.90

## 2020-11-30 LAB — RESP PANEL BY RT-PCR (FLU A&B, COVID) ARPGX2
Influenza A by PCR: NEGATIVE
Influenza B by PCR: NEGATIVE
SARS Coronavirus 2 by RT PCR: NEGATIVE

## 2020-11-30 MED ORDER — IBUPROFEN 600 MG PO TABS
600.0000 mg | ORAL_TABLET | Freq: Four times a day (QID) | ORAL | Status: DC
  Administered 2020-12-01 (×3): 600 mg via ORAL
  Filled 2020-11-30 (×3): qty 1

## 2020-11-30 MED ORDER — ONDANSETRON HCL 4 MG/2ML IJ SOLN: 4.0000 mg | INTRAMUSCULAR | Status: DC | PRN

## 2020-11-30 MED ORDER — LACTATED RINGERS IV SOLN: 500.0000 mL | INTRAVENOUS | Status: DC | PRN

## 2020-11-30 MED ORDER — OXYTOCIN 10 UNIT/ML IJ SOLN
INTRAMUSCULAR | Status: AC
  Filled 2020-11-30: qty 1

## 2020-11-30 MED ORDER — DIBUCAINE (PERIANAL) 1 % EX OINT: 1.0000 "application " | TOPICAL_OINTMENT | CUTANEOUS | Status: DC | PRN

## 2020-11-30 MED ORDER — TETANUS-DIPHTH-ACELL PERTUSSIS 5-2.5-18.5 LF-MCG/0.5 IM SUSY: 0.5000 mL | PREFILLED_SYRINGE | Freq: Once | INTRAMUSCULAR | Status: DC

## 2020-11-30 MED ORDER — OXYCODONE-ACETAMINOPHEN 5-325 MG PO TABS: 1.0000 | ORAL_TABLET | ORAL | Status: DC | PRN

## 2020-11-30 MED ORDER — LACTATED RINGERS IV SOLN: INTRAVENOUS | Status: DC

## 2020-11-30 MED ORDER — SOD CITRATE-CITRIC ACID 500-334 MG/5ML PO SOLN: 30.0000 mL | ORAL | Status: DC | PRN

## 2020-11-30 MED ORDER — OXYTOCIN BOLUS FROM INFUSION
333.0000 mL | Freq: Once | INTRAVENOUS | Status: DC
Start: 2020-11-30 — End: 2020-11-30

## 2020-11-30 MED ORDER — DIPHENHYDRAMINE HCL 25 MG PO CAPS
25.0000 mg | ORAL_CAPSULE | Freq: Four times a day (QID) | ORAL | Status: DC | PRN
Start: 2020-11-30 — End: 2020-12-01

## 2020-11-30 MED ORDER — COCONUT OIL OIL: 1.0000 "application " | TOPICAL_OIL | Status: DC | PRN

## 2020-11-30 MED ORDER — LIDOCAINE HCL (PF) 1 % IJ SOLN: 30.0000 mL | INTRAMUSCULAR | Status: DC | PRN

## 2020-11-30 MED ORDER — MEASLES, MUMPS & RUBELLA VAC IJ SOLR: 0.5000 mL | Freq: Once | INTRAMUSCULAR | Status: DC

## 2020-11-30 MED ORDER — SENNOSIDES-DOCUSATE SODIUM 8.6-50 MG PO TABS: 2.0000 | ORAL_TABLET | ORAL | Status: DC

## 2020-11-30 MED ORDER — WITCH HAZEL-GLYCERIN EX PADS: 1.0000 "application " | MEDICATED_PAD | CUTANEOUS | Status: DC | PRN

## 2020-11-30 MED ORDER — FERROUS SULFATE 325 (65 FE) MG PO TABS
325.0000 mg | ORAL_TABLET | ORAL | Status: DC
  Administered 2020-12-01: 09:00:00 325 mg via ORAL
  Filled 2020-11-30: qty 1

## 2020-11-30 MED ORDER — OXYTOCIN-SODIUM CHLORIDE 30-0.9 UT/500ML-% IV SOLN
2.5000 [IU]/h | INTRAVENOUS | Status: DC
Start: 2020-11-30 — End: 2020-11-30

## 2020-11-30 MED ORDER — ACETAMINOPHEN 325 MG PO TABS: 650.0000 mg | ORAL_TABLET | ORAL | Status: DC | PRN

## 2020-11-30 MED ORDER — ONDANSETRON HCL 4 MG PO TABS: 4.0000 mg | ORAL_TABLET | ORAL | Status: DC | PRN

## 2020-11-30 MED ORDER — BENZOCAINE-MENTHOL 20-0.5 % EX AERO: 1.0000 "application " | INHALATION_SPRAY | CUTANEOUS | Status: DC | PRN

## 2020-11-30 MED ORDER — ONDANSETRON HCL 4 MG/2ML IJ SOLN: 4.0000 mg | Freq: Four times a day (QID) | INTRAMUSCULAR | Status: DC | PRN

## 2020-11-30 MED ORDER — PRENATAL MULTIVITAMIN CH
1.0000 | ORAL_TABLET | Freq: Every day | ORAL | Status: DC
  Administered 2020-12-01: 13:00:00 1 via ORAL
  Filled 2020-11-30: qty 1

## 2020-11-30 MED ORDER — OXYCODONE-ACETAMINOPHEN 5-325 MG PO TABS: 2.0000 | ORAL_TABLET | ORAL | Status: DC | PRN

## 2020-11-30 MED ORDER — SIMETHICONE 80 MG PO CHEW: 80.0000 mg | CHEWABLE_TABLET | ORAL | Status: DC | PRN

## 2020-11-30 MED ORDER — FLEET ENEMA 7-19 GM/118ML RE ENEM: 1.0000 | ENEMA | RECTAL | Status: DC | PRN

## 2020-11-30 NOTE — Discharge Instructions (Signed)

## 2020-11-30 NOTE — MAU Note (Signed)
Pt reports ROM at 0200, mild contractions. Reports good fetal movement.

## 2020-11-30 NOTE — Progress Notes (Signed)
Paige Michael is a 29 y.o. Y1P5093 at [redacted]w[redacted]d by LMP admitted for SROM.   Subjective: Patient in the water birth tub at this time. Pt reports increased intensity of the contractions, but states she's able to breath through them.   Objective: BP 117/69   Pulse 97   Temp 97.9 F (36.6 C) (Oral)   Resp 18   Ht 5' 3.5" (1.613 m)   Wt 93.9 kg   LMP 02/10/2020   SpO2 100%   BMI 36.09 kg/m  No intake/output data recorded. No intake/output data recorded.  IA: 130 bpm, regular rhythm, accels present, no decels UC:   regular, every 2-4 minutes SVE:   Dilation: 3 Effacement (%): 80 Station: -3 Exam by:: [employer] RN  Labs: Lab Results  Component Value Date   HGB 10.0 09/11/2020   HCT 31 09/11/2020   PLT 286 09/11/2020    Assessment / Plan: Spontaneous labor, progressing normally  Labor: Progressing well, patient laboring in water birth tub Preeclampsia:  n/a Fetal Wellbeing:  Category I Pain Control:  Water tub I/D:  GBS negative Anticipated MOD:  NSVD  Valli Glance 11/30/2020, 9:02 AM

## 2020-11-30 NOTE — Progress Notes (Signed)
Patient ID: [name], female   DOB: 17-Oct-1991, 29 y.o.   MRN: [id number]  In tub currently, feeling pushy  VSS, afebrile FHTs 120s via intermittent auscultation orders Ctx q 2 mins Cx was 7-8cm earlier, presuming complete now  IUP@term  Active labor/transition  Push with urge Anticipate vag del Gaylan Gerold CNM also present  Myrtis Ser CNM 11/30/2020 12:10 PM

## 2020-11-30 NOTE — Discharge Summary (Signed)
Postpartum Discharge Summary     Patient Name: Paige Michael DOB: October 19, 1991 MRN: [id number]  Date of admission: 11/30/2020 Delivery date:11/30/2020  Delivering provider: Valli Glance  Date of discharge: 12/01/2020  Admitting diagnosis: Premature rupture of membranes [O42.90] Intrauterine pregnancy: [redacted]w[redacted]d    Secondary diagnosis:  Active Problems:   Amniotic fluid leaking  Additional problems: none    Discharge diagnosis: Term Pregnancy Delivered                                              Post partum procedures:none Augmentation: none Complications: None  Hospital course: Onset of Labor With Vaginal Delivery      29y.o. yo [id number] admitted in Latent Labor on 11/30/2020. Patient had an uncomplicated labor course, including some hydrotherapy, but eventually decided to deliver in the bed uneventfully.  Membrane Rupture Time/Date: 2:00 AM ,11/30/2020   Delivery Method:Vaginal, Spontaneous  Episiotomy: None  Lacerations:  None  Patient had an uncomplicated postpartum course.  She is ambulating, tolerating a regular diet, passing flatus, and urinating well. Patient is discharged home in stable condition on 12/01/20 per her request for early discharge as long as the baby can go as well.  Newborn Data: Birth date:11/30/2020  Birth time:2:08 PM  Gender:Female  Living status:Living  Apgars:6 ,8  WeOMBTDH:7416 (7lb 10.2oz)  Magnesium Sulfate received: No BMZ received: No Rhophylac:N/A MMR:N/A T-DaP:Given prenatally Flu: No Transfusion:No  Physical exam  Vitals:   11/30/20 1740 11/30/20 2204 12/01/20 0143 12/01/20 0540  BP: (!) 105/57 114/76 125/79 130/68  Pulse: 74 84 73 68  Resp: 16 18 16 16   Temp: 98.3 F (36.8 C) 98.3 F (36.8 C) 98.3 F (36.8 C) 98.5 F (36.9 C)  TempSrc:  Oral Oral Oral  SpO2: 99% 100% 97% 96%  Weight:      Height:       General: alert and cooperative Lochia: appropriate Uterine Fundus: firm Incision: N/A DVT  Evaluation: No evidence of DVT seen on physical exam. Labs: Lab Results  Component Value Date   HGB 10.0 09/11/2020   HCT 31 09/11/2020   PLT 286 09/11/2020   No flowsheet data found. Edinburgh Score: Edinburgh Postnatal Depression Scale Screening Tool 11/30/2020  I have been able to laugh and see the funny side of things. 0  I have looked forward with enjoyment to things. 0  I have blamed myself unnecessarily when things went wrong. 0  I have been anxious or worried for no good reason. 1  I have felt scared or panicky for no good reason. 0  Things have been getting on top of me. 1  I have been so unhappy that I have had difficulty sleeping. 0  I have felt sad or miserable. 1  I have been so unhappy that I have been crying. 0  The thought of harming myself has occurred to me. 0  Edinburgh Postnatal Depression Scale Total 3     After visit meds:  Allergies as of 12/01/2020   No Known Allergies     Medication List    TAKE these medications   b complex vitamins capsule Take 1 capsule by mouth daily.   ibuprofen 600 MG tablet Commonly known as: ADVIL Take 1 tablet (600 mg total) by mouth every 6 (six) hours as needed.   OMEGA 3 PO Take by  mouth.   POLYSACCHARIDE IRON COMPLEX PO Take 1 tablet by mouth daily.   PrePLUS 27-1 MG Tabs Take 1 tablet by mouth daily.        Discharge home in stable condition Infant Feeding: Breast Infant Disposition:home with mother Discharge instruction: per After Visit Summary and Postpartum booklet. Activity: Advance as tolerated. Pelvic rest for 6 weeks.  Diet: routine diet Future Appointments: Future Appointments  Date Time Provider [place]  01/05/2021  0:27 AM Arrie Senate, MD [facility]   Follow up Visit:   Myrtis Ser, CNM  P [facility] Admin Pool Please schedule this patient for Postpartum visit in: 4 weeks with the following provider: Any provider  In-Person (virtual if pt prefers)  For C/S  patients schedule nurse incision check in weeks 2 weeks: no  Low risk pregnancy complicated by: none  Delivery mode: SVD  Anticipated Birth Control: NFP/LAM  PP Procedures needed: none  Schedule Integrated BH visit: no   12/01/2020 Myrtis Ser, CNM  10:16 AM

## 2020-11-30 NOTE — H&P (Signed)
OBSTETRIC ADMISSION HISTORY AND PHYSICAL  Paige Michael is a 29 y.o. female G83P2002 with IUP at [redacted]w[redacted]d by LMP presenting for SROM. She reports +FMs, no VB, no blurry vision, headaches or peripheral edema, and RUQ pain.  She plans on breast feeding. She request NFP for birth control. She [facility] her [facility]   Dating: By LMP --->  Estimated Date of Delivery: 11/29/20  Prenatal History/Complications: n/a  Past Medical History: No past medical history on file.  Past Surgical History: No past surgical history on file.  Obstetrical History: OB History    Gravida  3   Para  2   Term  2   Preterm  0   AB  0   Living  2     SAB  0   IAB  0   Ectopic  0   Multiple  0   Live Births  2           Social History Social History   Socioeconomic History  . Marital status: Married    Spouse name: Not on file  . Number of children: Not on file  . Years of education: Not on file  . Highest education level: Not on file  Occupational History  . Not on file  Tobacco Use  . Smoking status: Not on file  . Smokeless tobacco: Not on file  Substance and Sexual Activity  . Alcohol use: Not on file  . Drug use: Not on file  . Sexual activity: Not on file  Other Topics Concern  . Not on file  Social History Narrative  . Not on file   Social Determinants of Health   Financial Resource Strain: Not on file  Food Insecurity: No Food Insecurity  . Worried About Charity fundraiser in the Last Year: Never true  . Ran Out of Food in the Last Year: Never true  Transportation Needs: No Transportation Needs  . Lack of Transportation (Medical): No  . Lack of Transportation (Non-Medical): No  Physical Activity: Not on file  Stress: Not on file  Social Connections: Not on file    Family History: No family history on file.  Allergies: No Known Allergies  Medications Prior to Admission  Medication Sig Dispense Refill Last Dose  . b complex vitamins capsule  Take 1 capsule by mouth daily.   11/29/2020 at Unknown time  . Omega-3 Fatty Acids (OMEGA 3 PO) Take by mouth.   11/29/2020 at Unknown time  . POLYSACCHARIDE IRON COMPLEX PO Take 1 tablet by mouth daily.   11/29/2020 at Unknown time  . Prenatal Vit-Fe Fumarate-FA (PREPLUS) 27-1 MG TABS Take 1 tablet by mouth daily.   11/29/2020 at Unknown time     Review of Systems   All systems reviewed and negative except as stated in HPI  Blood pressure 125/75, pulse 81, temperature 98.4 F (36.9 C), temperature source Oral, resp. rate 17, height 5' 3.5" (1.613 m), weight 93.9 kg, last menstrual period 02/10/2020, SpO2 100 %. General appearance: alert, cooperative and no distress Lungs: clear to auscultation bilaterally Heart: regular rate and rhythm Abdomen: soft, non-tender; bowel sounds normal Pelvic: n/a Extremities: Homans sign is negative, no sign of DVT DTR's +2 Presentation: cephalic Fetal monitoringBaseline: 125 bpm, Variability: Good {> 6 bpm), Accelerations: Reactive and Decelerations: Absent Uterine activityFrequency: Every 3-4 minutes Dilation: 1.5 Effacement (%): 60 Station: -3 Exam by:: J.Bellamy, RN   Prenatal labs: ABO, Rh: A/Positive/-- (06/03 0000) Antibody: Negative (06/03 0000) Rubella: Immune (  06/03 0000) RPR: Nonreactive (10/01 0000)  HBsAg: Negative (06/03 0000)  HIV: Non-reactive (06/03 0000)  GBS: Negative/-- (11/23 1621)   Prenatal Transfer Tool  Maternal Diabetes: No Genetic Screening: Normal Maternal Ultrasounds/Referrals: Normal Fetal Ultrasounds or other Referrals:  None Maternal Substance Abuse:  No Significant Maternal Medications:  None Significant Maternal Lab Results: Group B Strep negative  No results found for this or any previous visit (from the past 24 hour(s)).  Patient Active Problem List   Diagnosis Date Noted  . Premature rupture of membranes 11/30/2020  . Supervision of other normal pregnancy, antepartum 10/22/2020     Assessment/Plan:  Paige Michael is a 29 y.o. G3P2002 at [redacted]w[redacted]d here for SROM  #Labor: desires unmedicated, low intervention waterbirth. Reports hx of 2 unmedicated births including home birth with last pregnancy. Declines IV, labs due to extreme needle phobia. Patient is agreeable to IM pitocin if necessary PP but would prefer not. Discussed with patient waiting for COVID test before getting in tub. Intermittent monitoring and nipple stim as needed for now.  #Pain: hydrotherapy #FWB: Cat 1 #ID:  GBS neg  #MOF: breast #MOC: NFP #Circ:  [place], CNM  11/30/2020, 3:53 AM

## 2020-11-30 NOTE — Progress Notes (Signed)
Labor Progress Note Mardella Nuckles is a 29 y.o. [id number] at [redacted]w[redacted]d presented for SROM  S:  Patient reporting worsening contractions. Considering getting in the tub  O:  BP 129/81   Pulse 95   Temp 97.9 F (36.6 C) (Oral)   Resp 18   Ht 5' 3.5" (1.613 m)   Wt 93.9 kg   LMP 02/10/2020   SpO2 100%   BMI 36.09 kg/m   FHR: 130s with moderate variability and accels, no decels   CVE: Dilation: 3 Effacement (%): 60 Station: -3 Presentation: Vertex Exam by:: Len Blalock, CNM   A&P: 29 y.o. G3P2002 [redacted]w[redacted]d SROM #Labor: Progressing well. Frequent more painful contractions. Tub brought into room for when patient is ready.  #Pain: hydrotherapy #FWB: Cat 1 #GBS negative  Wende Mott, CNM 7:16 AM

## 2020-12-01 ENCOUNTER — Encounter: Payer: No Typology Code available for payment source | Admitting: Advanced Practice Midwife

## 2020-12-01 MED ORDER — IBUPROFEN 600 MG PO TABS: 600.0000 mg | ORAL_TABLET | Freq: Four times a day (QID) | ORAL | 0 refills | Status: DC | PRN

## 2020-12-01 NOTE — Social Work (Signed)
CSW received consult for hx postpartum depression.  CSW met with MOB to offer support and complete assessment.     CSW introduced self and role. MOB was observed holding newborn [place]. MOB was open to speaking with CSW and engaged throughout assessment. CSW informed MOB of the reason for consult. MOB expressed understanding. MOB reported experiencing PPD following the birth of her second child in 2016. MOB stated it took about 9 months for the PPD to present symptoms and attributes it to situational factors. MOB disclosed she was living in [place] with no friends or family,due to [place] being in the [employer]. MOB shared the symptoms presented as anger towards herself. MOB reported she was prescribed antidepressants, which were not helpful. MOB stated she engaged in teletherapy and had a therapist come to the home, which she found to be helpful in treating the symptoms. MOB stated she is doing better being back in the [place]. MOB reported she has family and friends locally which is helpful. MOB expressed she is currently feeling good and had a good birthing experience. MOB denies any current SI, HI or DV.  CSW provided education regarding the baby blues period vs. perinatal mood disorders, discussed treatment and gave resources for mental health follow up if concerns arise.  CSW recommends self-evaluation during the postpartum time period using the New Mom Checklist from Postpartum Progress and encouraged MOB to contact a medical professional if symptoms are noted at any time.   CSW provided review of Sudden Infant Death Syndrome (SIDS) precautions. MOB reported baby will sleep in a bassinet. MOB identified [facility] for follow-up care and denies any transportation barriers. MOB stated she has all of the essential needs for baby, including a car seat. MOB declined any additional referrals or resources at this time.    CSW identifies no further need for intervention and no barriers to  discharge at this time.  Darra Lis, [place] [employer] [phone] .

## 2020-12-01 NOTE — Progress Notes (Signed)
Post Partum Day 1 Subjective: Paige Michael  is a 29 y.o. [id number] s/p SVD at [redacted]w[redacted]d.  She reports she is doing well. No acute events overnight. She denies any problems with ambulating, voiding or po intake. Denies nausea or vomiting.  Pain is well controlled on ibuprofen.  Lochia is moderate and improving.  Objective: Blood pressure 130/68, pulse 68, temperature 98.5 F (36.9 C), temperature source Oral, resp. rate 16, height 5' 3.5" (1.613 m), weight 93.9 kg, last menstrual period 02/10/2020, SpO2 96 %, unknown if currently breastfeeding.  Physical Exam:  General: alert, cooperative, appears stated age and no distress Lochia: appropriate Uterine Fundus: firm Incision: n/a Laceration: none DVT Evaluation: No evidence of DVT seen on physical exam. No significant calf/ankle edema.  No results for input(s): HGB, HCT in the last 72 hours.  Assessment/Plan: Discharge home, Breastfeeding and Contraception NFP   LOS: 1 day   Valli Glance 12/01/2020, 9:34 AM

## 2021-01-05 ENCOUNTER — Ambulatory Visit: Payer: No Typology Code available for payment source | Admitting: Family Medicine

## 2021-01-05 ENCOUNTER — Encounter: Payer: Self-pay | Admitting: Family Medicine

## 2021-12-12 NOTE — L&D Delivery Note (Signed)
   Delivery Note:   [id number] at [redacted]w[redacted]d Admitting diagnosis: Amniotic fluid leaking [O42.90] Risks: n/a   First Stage:  Induction of labor:n/a  Onset of labor:09/24/22 @ 0500 Augmentation: Cytotec x1 50 buccal  ROM: PROM'd @ 0500, clear fluid  Active labor onset: '@1915'$   Analgesia /Anesthesia/Pain control intrapartum: None   Second Stage:  Complete dilation at   09/24/22 2015 Onset of pushing at @ 2020 FHR second stage Audible 110 dopplers   CNM called to bedside patient in watertub feeling the urge to push. Patient Pushing in hands and knees position with 2 CNMs and L&D staff support at bedside. FOB present for birth and supportive.   Delivery of a Live born female  Birth Weight:   APGAR: 812 9  Newborn Delivery   Birth date/time: 09/24/2022 20:37:00 Delivery type: Vaginal, Spontaneous     With maternal pushing efforts Fetal head delivered underwater in cephalic presentation,  DOA position and remaining fetal body forcefully expelled on remaining contraction. Mother reached down and gently held baby above the water. Infant vigorously crying at 1 minute of life. Nuchal Cord: No    Water observed to deep rose in color, and After 3 mins of life cord double clamped and cut by Mother of baby. Infant given to dad and mom assisted from tub to bed without difficulty. Once in bed, infant placed back skin to skin with mother.  Collection of cord blood for typing completed. Cord blood donation-None  Arterial cord blood sample-No    Third Stage:  With gentle cord traction and maternal pushing efforts Placenta delivered-Spontaneous  intact with 3 vessels . Uterine tone firm bleeding brisk from upper clitoris. Pressure applied.  Placenta to L&D for disposal.   Clitoral laceration identified with brisk bleeding. Dr. AHarolyn Rutherfordcalled to bedside to assess laceration.  Episiotomy: N/A Local analgesia: 1% Lidocaine without epi given by MD.   Repair: Clitoral repair completed with 4.0  Monocryl with 2 figure-8 suture and became hemostatic by MD.    Est. Blood Loss (mWG):665.99  Complications: None   Mom to postpartum.  Baby boy "AArnell Sieving to Couplet care / Skin to Skin.  Delivery Report:  Review the Delivery Report for details.    Dailen Mcclish (Isaias Sakai PRollene Rotunda MSN, [place] [employer] 09/24/22 9:17 PM

## 2022-02-10 ENCOUNTER — Other Ambulatory Visit: Payer: Self-pay

## 2022-02-10 ENCOUNTER — Encounter: Payer: Self-pay | Admitting: Family

## 2022-02-10 ENCOUNTER — Ambulatory Visit (INDEPENDENT_AMBULATORY_CARE_PROVIDER_SITE_OTHER): Payer: No Typology Code available for payment source | Admitting: Family

## 2022-02-10 VITALS — BP 108/74 | HR 74 | Temp 98.0°F | Ht 63.5 in | Wt 181.4 lb

## 2022-02-10 DIAGNOSIS — Z0001 Encounter for general adult medical examination with abnormal findings: Secondary | ICD-10-CM

## 2022-02-10 DIAGNOSIS — D1721 Benign lipomatous neoplasm of skin and subcutaneous tissue of right arm: Secondary | ICD-10-CM

## 2022-02-10 DIAGNOSIS — F331 Major depressive disorder, recurrent, moderate: Secondary | ICD-10-CM | POA: Diagnosis not present

## 2022-02-10 DIAGNOSIS — F320 Major depressive disorder, single episode, mild: Secondary | ICD-10-CM | POA: Insufficient documentation

## 2022-02-10 LAB — COMPREHENSIVE METABOLIC PANEL
ALT: 32 U/L (ref 0–35)
AST: 29 U/L (ref 0–37)
Albumin: 3.4 g/dL — ABNORMAL LOW (ref 3.5–5.2)
Alkaline Phosphatase: 79 U/L (ref 39–117)
BUN: 9 mg/dL (ref 6–23)
CO2: 23 mEq/L (ref 19–32)
Calcium: 8.7 mg/dL (ref 8.4–10.5)
Chloride: 105 mEq/L (ref 96–112)
Creatinine, Ser: 0.5 mg/dL (ref 0.40–1.20)
GFR: 124.57 mL/min (ref >60.00)
Glucose, Bld: 76 mg/dL (ref 70–99)
Potassium: 3.8 mEq/L (ref 3.5–5.1)
Sodium: 138 mEq/L (ref 135–145)
Total Bilirubin: 0.5 mg/dL (ref 0.2–1.2)
Total Protein: 6.7 g/dL (ref 6.0–8.3)

## 2022-02-10 LAB — CBC WITH DIFFERENTIAL/PLATELET
Basophils Absolute: 0 10*3/uL (ref 0.0–0.1)
Basophils Relative: 0.3 % (ref 0.0–3.0)
Eosinophils Absolute: 0.1 10*3/uL (ref 0.0–0.7)
Eosinophils Relative: 1.5 % (ref 0.0–5.0)
HCT: 40.5 % (ref 36.0–46.0)
Hemoglobin: 13.7 g/dL (ref 12.0–15.0)
Lymphocytes Relative: 29.7 % (ref 12.0–46.0)
Lymphs Abs: 2 10*3/uL (ref 0.7–4.0)
MCHC: 33.7 g/dL (ref 30.0–36.0)
MCV: 91.2 fl (ref 78.0–100.0)
Monocytes Absolute: 0.7 10*3/uL (ref 0.1–1.0)
Monocytes Relative: 10.1 % (ref 3.0–12.0)
Neutro Abs: 3.9 10*3/uL (ref 1.4–7.7)
Neutrophils Relative %: 58.4 % (ref 43.0–77.0)
Platelets: 301 10*3/uL (ref 150.0–400.0)
RBC: 4.44 Mil/uL (ref 3.87–5.11)
RDW: 13.8 % (ref 11.5–15.5)
WBC: 6.6 10*3/uL (ref 4.0–10.5)

## 2022-02-10 LAB — LIPID PANEL
Cholesterol: 171 mg/dL (ref 0–200)
HDL: 44.2 mg/dL (ref >39.00)
LDL Cholesterol: 113 mg/dL — ABNORMAL HIGH (ref 0–99)
NonHDL: 126.52
Total CHOL/HDL Ratio: 4
Triglycerides: 68 mg/dL (ref 0.0–149.0)
VLDL: 13.6 mg/dL (ref 0.0–40.0)

## 2022-02-10 LAB — TSH: TSH: 4.18 u[IU]/mL (ref 0.35–5.50)

## 2022-02-10 NOTE — Assessment & Plan Note (Signed)
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 She would like to have counseling, sending referral today. ?

## 2022-02-10 NOTE — Patient Instructions (Signed)
Welcome to [facility]! It was a pleasure meeting you today. ? ?I have sent referrals to general surgery regarding your lipoma and for therapy. They will call you directly to schedule appointments.  ?As discussed, I would contact your insurance company and see which therapy offices they will cover so you can try and call on your own to get an appointment faster, as our office is backed up by 1-2 months. ? ?Let us know if you have any future concerns! ? ? ? ?PLEASE NOTE: ? ?If you had any LAB tests please let us know if you have not heard back within a few days. You may see your results on MyChart before we have a chance to review them but we will give you a call once they are reviewed by [place]. If we ordered any REFERRALS today, please let us know if you have not heard from their office within the next week.  ?Let us know through MyChart if you are needing REFILLS, or have your pharmacy send [place] the request. You can also use MyChart to communicate with me or any office staff. ? ?Please try these tips to maintain a healthy lifestyle: ? ?Eat most of your calories during the day when you are active. Eliminate processed foods including packaged sweets (pies, cakes, cookies), reduce intake of potatoes, white bread, white pasta, and white rice. Look for whole grain options, oat flour or almond flour. ? ?Each meal should contain half fruits/vegetables, one quarter protein, and one quarter carbs (no bigger than a computer mouse). ? ?Cut down on sweet beverages. This includes juice, soda, and sweet tea. Also watch fruit intake, though this is a healthier sweet option, it still contains natural sugar! Limit to 3 servings daily. ? ?Drink at least 1 glass of water with each meal and aim for at least 8 glasses per day ? ?Exercise at least 150 minutes every week.  ? ?

## 2022-02-10 NOTE — Progress Notes (Signed)
?Phone [phone] ?  ?Subjective:  ? ?Patient is a 31 y.o. female presenting for annual physical.   ? ?Chief Complaint  ?Patient presents with  ? Establish Care  ? Depression  ? Annual Exam  ? Fatty tumor  ? ?HPI: ?Lipoma:  pt reports having this for many years prior to having her children, she denies pain or paresthesias, and does not think it has grown in size for last few years, but she would like to have removed. ?Anxiety/Depression: Patient complains of anxiety disorder and depression.   ?She has the following symptoms: see below screening.  ?Onset of symptoms was approximately 6 months ago, She denies current suicidal and homicidal ideation.  ?Possible organic causes contributing are: none.  ?Risk factors: none  ?Previous treatment includes nothing.  pt does not want to take medication, prefers counseling. ?Depression screen [facility] 2/9 02/10/2022  ?Decreased Interest 1  ?Down, Depressed, Hopeless 3  ?PHQ - 2 Score 4  ?Altered sleeping 1  ?Tired, decreased energy 1  ?Change in appetite 2  ?Feeling bad or failure about yourself  3  ?Trouble concentrating 0  ?Moving slowly or fidgety/restless 0  ?Suicidal thoughts 3  ?PHQ-9 Score 14  ?Difficult doing work/chores Somewhat difficult  ? ?GAD 7 : Generalized Anxiety Score 12/23/2020  ?Nervous, Anxious, on Edge 0  ?Control/stop worrying 0  ?Worry too much - different things 1  ?Trouble relaxing 0  ?Restless 0  ?Easily annoyed or irritable 0  ?Afraid - awful might happen 0  ?Total GAD 7 Score 1  ? ?See problem oriented charting- ?ROS- full  review of systems was completed and negative ?except for Depression noted in HPI above. ? ?The following were reviewed and entered/updated in epic: ?Past Medical History:  ?Diagnosis Date  ? Amniotic fluid leaking 11/30/2020  ? Supervision of other normal pregnancy, antepartum 10/22/2020  ?  Nursing Staff Provider  Office Location [facility] Dating  LMP  Language  English  Anatomy US  Normal  Flu Vaccine  No  Genetic Screen  NIPS:  Low fetal  fraction x 2 AFP:    First Screen: neg   TDaP Vaccine  Done at [facility] Hgb A1C or  GTT Early  Third trimester Nl 1 hour  [place] 4/9 and 5/7    LAB RESULTS   Rhogam  NA Blood Type A/Positive/-- (06/03 0000)   Feeding Plan Breast Antibody Negativ  ? ?Patient Active Problem List  ? Diagnosis Date Noted  ? Moderate recurrent major depression ([place]) 02/10/2022  ? ?Past Surgical History:  ?Procedure Laterality Date  ? Pyloric stenosis  1991  ? [place] EXTRACTION  2009  ? ? ?History reviewed. No pertinent family history. ? ?Medications- reviewed and updated ?Current Outpatient Medications  ?Medication Sig Dispense Refill  ? b complex vitamins capsule Take 1 capsule by mouth daily.    ? POLYSACCHARIDE IRON COMPLEX PO Take 1 tablet by mouth daily.    ? ?No current facility-administered medications for this visit.  ? ? ?Allergies-reviewed and updated ?No Known Allergies ? ?Social History  ? ?Social History Narrative  ? Not on file  ? ?Objective  ?Objective:  ?BP 108/74   Pulse 74   Temp 98 ?F (36.7 ?C) (Temporal)   Ht 5' 3.5" (1.613 m)   Wt 181 lb 6.4 oz (82.3 kg)   SpO2 99%   BMI 31.63 kg/m?  ?Gen: NAD, resting comfortably ?HEENT: Mucous membranes are moist. Oropharynx normal ?Neck: no thyromegaly ?CV: RRR no murmurs rubs or gallops ?  Lungs: CTAB no crackles, wheeze, rhonchi ?Abdomen: soft/nontender/nondistended/normal bowel sounds. No rebound or guarding.  ?Ext: no edema ?Skin: warm, dry ?Neuro: grossly normal, moves all extremities, PERRLA ?  ?Assessment and Plan  ? ?Health Maintenance counseling: ?1. Anticipatory guidance: Patient counseled regarding regular dental exams q6 months, eye exams,  avoiding smoking and second hand smoke, limiting alcohol to 1 beverage per day, no illicit drugs.   ?2. Risk factor reduction:  Advised patient of need for regular exercise and diet rich with fruits and vegetables to reduce risk of heart attack and stroke. ?Wt Readings from Last 3 Encounters:  ?02/10/22 181 lb 6.4  oz (82.3 kg)  ?11/30/20 207 lb (93.9 kg)  ?11/24/20 206 lb 14.4 oz (93.8 kg)  ? ?3. Immunizations/screenings/ancillary studies ?Immunization History  ?Administered Date(s) Administered  ? PFIZER(Purple Top)SARS-COV-2 Vaccination 03/20/2020, 04/17/2020  ? Tdap 10/14/2020  ? ?Health Maintenance Due  ?Topic Date Due  ? Hepatitis C Screening  Never done  ?  ?4. Cervical cancer screening: 2 years ago. ?5. Skin cancer screening- advised regular sunscreen use. Denies worrisome, changing, or new skin lesions.  ?6. Birth control/STD check: none, denies need for testing ?7. Smoking associated screening: non- smoker ?8. Alcohol screening: None ? ?Problem List Items Addressed This Visit   ? ?  ? Other  ? Moderate recurrent major depression ([place])  ?  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 She would like to have counseling, sending referral today. ?  ?  ? Relevant Orders  ? Ambulatory referral to Psychology  ? ?Other Visit Diagnoses   ? ? Encounter for general adult medical examination with abnormal findings    -  Primary  ? Relevant Orders  ? Comprehensive metabolic panel (Completed)  ? TSH (Completed)  ? Lipid panel (Completed)  ? CBC with Differential/Platelet (Completed)  ? Lipoma of right upper extremity      ? reports having this for many years prior to having her children, she denies pain or paresthesias, and does not think it has grown in size for last few years, but she would like to have removed. Referring to general surgery. ? ?Relevant Orders  ? Ambulatory referral to General Surgery  ? ?  ?Recommended follow up: Return for any future concerns. ?No future appointments. ? ?Lab/Order associations: non- fasting ?  ICD-10-CM   ?1. Encounter for general adult medical examination with abnormal findings  Z00.01 Comprehensive metabolic panel  ?  TSH  ?  Lipid panel  ?  CBC  with Differential/Platelet  ?  ?2. Lipoma of right upper extremity  D17.21 Ambulatory referral to General Surgery  ?  ?3. Moderate recurrent major depression ([place])  F33.1 Ambulatory referral to Psychology  ?  ? ?  ?Jeanie Sewer, NP ? ? ? ?

## 2022-03-01 ENCOUNTER — Ambulatory Visit (INDEPENDENT_AMBULATORY_CARE_PROVIDER_SITE_OTHER): Payer: No Typology Code available for payment source | Admitting: General Practice

## 2022-03-01 ENCOUNTER — Encounter: Payer: Self-pay | Admitting: General Practice

## 2022-03-01 ENCOUNTER — Other Ambulatory Visit: Payer: Self-pay

## 2022-03-01 DIAGNOSIS — Z3491 Encounter for supervision of normal pregnancy, unspecified, first trimester: Secondary | ICD-10-CM | POA: Diagnosis not present

## 2022-03-01 DIAGNOSIS — Z3201 Encounter for pregnancy test, result positive: Secondary | ICD-10-CM

## 2022-03-01 LAB — POCT PREGNANCY, URINE: Preg Test, Ur: POSITIVE — AB

## 2022-03-01 MED ORDER — PREPLUS 27-1 MG PO TABS
1.0000 | ORAL_TABLET | Freq: Every day | ORAL | 11 refills | Status: AC
Start: 2022-03-01 — End: ?

## 2022-03-01 NOTE — Progress Notes (Signed)
Patient came by office and dropped off urine sample for UPT. UPT +. ? ?Called patient at home and she reports first positive home test about 2.5 weeks ago. She is still breastfeeding her 43 month old and has not had a period since before that pregnancy. She reports she noticed continued fatigue and nausea/vomiting following a stomach bug a few weeks ago which is when she took a pregnancy test. Scheduled patient for ultrasound 3/29 @ 11am to establish dating for this pregnancy. PNV sent to pharmacy per patient request. Patient will schedule new OB appt following ultrasound visit.  ? ?Koren Bound RN BSN ?03/01/22 ? ?

## 2022-03-09 ENCOUNTER — Encounter: Payer: Self-pay | Admitting: Medical

## 2022-03-09 ENCOUNTER — Other Ambulatory Visit: Payer: Self-pay

## 2022-03-09 ENCOUNTER — Ambulatory Visit
Admission: RE | Admit: 2022-03-09 | Discharge: 2022-03-09 | Disposition: A | Discharge status: Home / Self Care | Payer: No Typology Code available for payment source | Source: Ambulatory Visit | Attending: Family Medicine | Admitting: Family Medicine

## 2022-03-09 ENCOUNTER — Other Ambulatory Visit: Payer: Self-pay | Admitting: Family Medicine

## 2022-03-09 ENCOUNTER — Telehealth: Payer: Self-pay | Admitting: Medical

## 2022-03-09 DIAGNOSIS — Z3491 Encounter for supervision of normal pregnancy, unspecified, first trimester: Secondary | ICD-10-CM

## 2022-03-09 DIAGNOSIS — Z3A12 12 weeks gestation of pregnancy: Secondary | ICD-10-CM | POA: Insufficient documentation

## 2022-03-09 DIAGNOSIS — Z3687 Encounter for antenatal screening for uncertain dates: Secondary | ICD-10-CM | POA: Diagnosis present

## 2022-03-09 NOTE — Telephone Encounter (Signed)
I called Paige Michael today at 1:40 PM and confirmed patient's identity using two patient identifiers. [place] results from earlier today were reviewed. Patient is not yet scheduled for new OB visit. I have sent a message to the front office at [facility] to schedule a new OB visit, first available as this is the patient's preferred location. First trimester warning signs reviewed. Patient voiced understanding and had no further questions.  ? ?US OB Comp Less 14 Wks ? ?Result Date: 03/09/2022 ?CLINICAL DATA:  Uncertain LMP, confirm dating. EXAM: OBSTETRIC <14 WK [place] AND TRANSVAGINAL OB US TECHNIQUE: Both transabdominal and transvaginal ultrasound examinations were performed for complete evaluation of the gestation as well as the maternal uterus, adnexal regions, and pelvic cul-de-sac. Transvaginal technique was performed to assess early pregnancy. COMPARISON:  None. FINDINGS: Intrauterine gestational sac: Single Yolk sac:  Not Visualized. Embryo:  Visualized. Cardiac Activity: Visualized. Heart Rate: 169 bpm CRL:  53.4 mm   12 w   0 d                  [place] EDC: 09/21/2022 Subchorionic hemorrhage:  None visualized. Maternal uterus/adnexae: Normal IMPRESSION: Single live intrauterine gestation with approximate gestational age of [redacted] weeks and 0 days based upon crown-rump length. Electronically Signed   By: Keane Police D.O.   On: 03/09/2022 12:01   ? ?Luvenia Redden, PA-C ?03/09/2022 1:40 PM  ? ?

## 2022-03-24 ENCOUNTER — Telehealth (INDEPENDENT_AMBULATORY_CARE_PROVIDER_SITE_OTHER): Payer: No Typology Code available for payment source

## 2022-03-24 DIAGNOSIS — Z3A00 Weeks of gestation of pregnancy not specified: Secondary | ICD-10-CM

## 2022-03-24 DIAGNOSIS — Z789 Other specified health status: Secondary | ICD-10-CM

## 2022-03-24 DIAGNOSIS — Z3480 Encounter for supervision of other normal pregnancy, unspecified trimester: Secondary | ICD-10-CM | POA: Insufficient documentation

## 2022-03-24 NOTE — Progress Notes (Signed)
New OB Intake ? ?I connected with  Paige Michael on 03/24/22 at 11:15 AM EDT by MyChart Video Visit and verified that I am speaking with the correct person using two identifiers. Nurse is located at [facility] and pt is located at Paige Michael. ? ?I discussed the limitations, risks, security and privacy concerns of performing an evaluation and management service by telephone and the availability of in person appointments. I also discussed with the patient that there may be a patient responsible charge related to this service. The patient expressed understanding and agreed to proceed. ? ?I explained I am completing New OB Intake today. We discussed her EDD of 09/21/22 that is based on U/S on 03/09/22. Pt is G4/P3. I reviewed her allergies, medications, Medical/Surgical/OB history, and appropriate screenings. I informed her of [facility] services. Based on history, this is a/an  pregnancy uncomplicated .  ? ?Patient Active Problem List  ? Diagnosis Date Noted  ? 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 ? ?Concerns addressed today ? ?Delivery Plans:  ?Plans to deliver at [facility].  ? ?MyChart/Babyscripts ?MyChart access verified. I explained pt will have some visits in office and some virtually. Babyscripts instructions given and order placed. Patient verifies receipt of registration text/e-mail. Account successfully created and app downloaded. ? ?Blood Pressure Cuff  ?Pt has own BP Cuff, Explained after first prenatal appt pt will check weekly and document in 77. ? ?Weight scale: Patient does / does not  have weight scale. Weight scale ordered for patient to pick up from [employer].  ? ?Anatomy US ?Explained first scheduled [place] will be around 19 weeks. Anatomy US scheduled for 04/27/22 at 1045. Pt notified to arrive at 1030. ?Scheduled AFP lab only appointment if CenteringPregnancy pt for same day as anatomy US.  ? ?Labs ?Discussed Johnsie Cancel genetic screening with patient. Would like both  Panorama and Horizon drawn at new OB visit.Also if interested in genetic testing, tell patient she will need AFP 15-21 weeks to complete genetic testing .Routine prenatal labs needed. ? ?Covid Vaccine ?Patient has covid vaccine.  ? ?Is patient a CenteringPregnancy candidate? Pt declined stating that she has her own support system  "Centering Patient" indicated on sticky note ?  ?Is patient a Mom+Baby Combined Care candidate? Not a candidate   Scheduled with Mom+Baby provider  ?  ?Is patient interested in [place]? Yes  "Interested in WB - Schedule next visit with CNM" on sticky note ? ?Informed patient of [facility] website  and placed link in her AVS.  ? ?Social Determinants of Health ?Food Insecurity: Patient denies food insecurity. ?WIC Referral: Patient is not interested in referral to [facility].  ?Transportation: Patient denies transportation needs. ?Childcare: Discussed no children allowed at ultrasound appointments. Offered childcare services; patient declines childcare services at this time. ? ?Send link to Pregnancy Navigators ? ? ?Placed OB Box on problem list and updated ? ?First visit review ?I reviewed new OB appt with pt. I explained she will have a pelvic exam, ob bloodwork with genetic screening, and PAP smear. Explained pt will be seen by Kerry Hough, PA-C at first visit; encounter routed to appropriate provider. Explained that patient will be seen by pregnancy navigator following visit with provider. [facility] information placed in AVS.  ? ?Bethanne Ginger, CMA ?03/24/2022  11:24 AM  ?

## 2022-03-24 NOTE — Patient Instructions (Signed)
Safe Medications in Pregnancy  ? ?Acne:  ?Benzoyl Peroxide  ?Salicylic Acid  ? ?Backache/Headache:  ?Tylenol: 2 regular strength every 4 hours OR  ?             2 Extra strength every 6 hours  ? ?Colds/Coughs/Allergies:  ?Benadryl (alcohol free) 25 mg every 6 hours as needed  ?Breath right strips  ?Claritin  ?Cepacol throat lozenges  ?Chloraseptic throat spray  ?Cold-Eeze- up to three times per day  ?Cough drops, alcohol free  ?Flonase (by prescription only)  ?Guaifenesin  ?Mucinex  ?Robitussin DM (plain only, alcohol free)  ?Saline nasal spray/drops  ?Sudafed (pseudoephedrine) & Actifed * use only after [redacted] weeks gestation and if you do not have high blood pressure  ?Tylenol  ?Vicks Vaporub  ?Zinc lozenges  ?Zyrtec  ? ?Constipation:  ?Colace  ?Ducolax suppositories  ?Fleet enema  ?Glycerin suppositories  ?Metamucil  ?Milk of magnesia  ?Miralax  ?Senokot  ?Smooth move tea  ? ?Diarrhea:  ?Kaopectate  ?Imodium A-D  ? ?*NO pepto Bismol  ? ?Hemorrhoids:  ?Anusol  ?Anusol HC  ?Preparation H  ?Tucks  ? ?Indigestion:  ?Tums  ?Maalox  ?Mylanta  ?Zantac  ?Pepcid  ? ?Insomnia:  ?Benadryl (alcohol free) '25mg'$  every 6 hours as needed  ?Tylenol PM  ?Unisom, no Gelcaps  ? ?Leg Cramps:  ?Tums  ?MagGel  ? ?Nausea/Vomiting:  ?Bonine  ?Dramamine  ?Emetrol  ?Ginger extract  ?Sea bands  ?Meclizine  ?Nausea medication to take during pregnancy:  ?Unisom (doxylamine succinate 25 mg tablets) Take one tablet daily at bedtime. If symptoms are not adequately controlled, the dose can be increased to a maximum recommended dose of two tablets daily (1/2 tablet in the morning, 1/2 tablet mid-afternoon and one at bedtime).  ?Vitamin B6 '100mg'$  tablets. Take one tablet twice a day (up to 200 mg per day).  ? ?Skin Rashes:  ?Aveeno products  ?Benadryl cream or '25mg'$  every 6 hours as needed  ?Calamine Lotion  ?1% cortisone cream  ? ?Yeast infection:  ?Gyne-lotrimin 7  ?Monistat 7  ? ? ?**If taking multiple medications, please check labels to avoid  duplicating the same active ingredients  ?**take medication as directed on the label  ?** Do not exceed 4000 mg of tylenol in 24 hours  ?**Do not take medications that contain aspirin or ibuprofen  ?    ?   ?  ?At our [facility] OB/GYN Practices, we work as an integrated team, providing care to address both physical and emotional health. Your medical provider may refer you to see our [facility]) on the same day you see your medical provider, as availability permits; often scheduled virtually at your convenience.  ?Our [facility] is available to all patients, visits generally last between 20-30 minutes, but can be longer or shorter, depending on patient need. The [facility] offers help with stress management, coping with symptoms of depression and anxiety, major life changes , sleep issues, changing risky behavior, grief and loss, life stress, working on personal life goals, and  behavioral health issues, as these all affect your overall health and wellness.  ?The [facility] is NOT available for the following: FMLA paperwork, court-ordered evaluations, specialty assessments (custody or disability), letters to employers, or obtaining certification for an emotional support animal. The [facility] does not provide long-term therapy. ?You have the right to refuse integrated behavioral health services, or to reschedule to see the [facility] at a later date.  ?Confidentiality exception: If it is suspected that a child or  disabled adult is being abused or neglected, we are required by law to report that to either Child Protective Services or Adult Scientist, forensic.  ?If you have a diagnosis of Bipolar affective disorder, Schizophrenia, or recurrent Major depressive disorder, we will recommend that you establish care with a psychiatrist, as these are lifelong, chronic conditions, and we want your overall emotional health and medications to be more closely monitored. If you anticipate needing extended maternity leave due to mental health  issues postpartum, it it recommended you inform your medical provider, so we can put in a referral to a psychiatrist as soon as possible. The [facility] is unable to recommend an extended maternity leave for mental health issues. ?Your medical provider or [facility] may refer you to a therapist for ongoing, traditional therapy, or to a psychiatrist, for medication management, if it would benefit your overall health. ?Depending on your insurance, you may have a copay or be charged a deductible, depending on your insurance, to see the [facility]. If you are uninsured, it is recommended that you apply for financial assistance. (Forms may be requested at the front desk for in-person visits, via MyChart, or request a form during a virtual visit).  ?If you see the [facility] more than 6 times, you will have to complete a comprehensive clinical assessment interview with the [facility] to resume integrated services.  ?For virtual visits with the [facility], you must be physically in the state of [place] at the time of the visit. For example, if you live in [place], you will have to do an in-person visit with the [facility], and your out-of-state insurance may not cover behavioral health services in [place]. If you are going out of the state or country for any reason, the [facility] may see you virtually when you return to [place], but not while you are physically outside of [place].  ?  ?

## 2022-03-24 NOTE — Progress Notes (Signed)
Pt wanted to know if they have Nitrous Oxide available in the hospital, she also wanted to go over her diet and the amount of weight that is acceptable for her to gain during pregnancy. ?

## 2022-04-07 ENCOUNTER — Ambulatory Visit (INDEPENDENT_AMBULATORY_CARE_PROVIDER_SITE_OTHER): Payer: No Typology Code available for payment source | Admitting: Registered"

## 2022-04-07 ENCOUNTER — Encounter: Payer: No Typology Code available for payment source | Attending: Medical | Admitting: Registered"

## 2022-04-07 DIAGNOSIS — Z3480 Encounter for supervision of other normal pregnancy, unspecified trimester: Secondary | ICD-10-CM | POA: Insufficient documentation

## 2022-04-07 DIAGNOSIS — Z713 Dietary counseling and surveillance: Secondary | ICD-10-CM | POA: Insufficient documentation

## 2022-04-07 DIAGNOSIS — E663 Overweight: Secondary | ICD-10-CM | POA: Insufficient documentation

## 2022-04-07 DIAGNOSIS — Z6829 Body mass index (BMI) 29.0-29.9, adult: Secondary | ICD-10-CM

## 2022-04-07 NOTE — Progress Notes (Signed)
Medical Nutrition Therapy  ?Appointment Start time:  4975  Appointment End time:  1405 ? ?Primary concerns today: Appropriate weight gain in pregnancy, safe exercise (weight lifting) in pregnancy ?Referral diagnosis: BMI 29.45 ?Preferred learning style: no preference indicated ?Learning readiness: ready, change in progress ? ?NUTRITION ASSESSMENT  ? ?Anthropometrics  ?Wt Readings from Last 3 Encounters:  ?04/07/22 177 lb (80.3 kg)  ?02/10/22 181 lb 6.4 oz (82.3 kg)  ?11/30/20 207 lb (93.9 kg)  ?  ?Clinical ?Medical Hx: obesity ?Medications: prenatal vitamin ?Labs: A1c 4.9% 05/14/20 ?Notable Signs/Symptoms: BMI 29.45 ? ?Lifestyle & Dietary Hx ?Pt reports that her weight had been staying ~188-190 lb.  ?Pt states had some rapid weight loss that was very motivating. ?(16 lbs prior to finding out she was pregnant).  ? ?Pt states she wants to know if it is safe to continue losing weight during pregnancy and knows that she is not supposed to lose weight after 1st trimester. Pt had questions about how many calories and macro ratios she should be aiming for. RD provided general guidelines and handouts. ? ?Pt also states that she enjoys the weight lifting routine that she had started and wants to know if safe to continue during pregnancy. RD will reach out to our Physical Therapist, Earlie Counts for input. ? ?Pt states she has been successful in losing weight using several different strategies including calorie deficit (800 cal/day) with high protein with maintenance 1200-1500 calories; Keto, Intermittent fasting. Pt states she doesn't want to obsessively count calories and wants to be careful not to slip into disordered eating or cause bad example for her children. ? ?Pt states she eats fish regularly as part of her protein intake.  ? ?Estimated daily fluid intake: 2 L water ?Supplements: only prenatal now ?Sleep: not assessed ?Stress / self-care: not assessed ?Current average weekly physical activity: 4x/week, 60 min moderate  including cycling and weight machines.  ? ?NUTRITION DIAGNOSIS  ?NB-1.1 Food and nutrition-related knowledge deficit As related to appropriate weight gain in pregnancy.  As evidenced by stated desire to learn amount of weight should be her target. ? ? ?NUTRITION INTERVENTION  ?Nutrition education (E-1) on the following topics:  ?Appropriate amount of weight gain in pregnancy ?Balanced eating and different types of eating patterns ?Monitoring Non-hunger eating as an option instead of calorie counting ? ?Handouts Provided Include  ?NIH weight gain guidelines in pregnancy / obese starting weight ?MyPlate for Moms ?FDA Fish guidelines ? ?Learning Style & Readiness for Change ?Teaching method utilized: Visual & Auditory  ?Demonstrated degree of understanding via: Teach Back  ?Barriers to learning/adherence to lifestyle change: none ? ?MONITORING & EVALUATION ?Dietary intake, weekly physical activity, and weight gain in prn. ?

## 2022-04-08 ENCOUNTER — Ambulatory Visit (INDEPENDENT_AMBULATORY_CARE_PROVIDER_SITE_OTHER): Payer: No Typology Code available for payment source | Admitting: Medical

## 2022-04-08 ENCOUNTER — Other Ambulatory Visit (HOSPITAL_COMMUNITY)
Admission: RE | Admit: 2022-04-08 | Discharge: 2022-04-08 | Disposition: A | Discharge status: Home / Self Care | Payer: No Typology Code available for payment source | Source: Ambulatory Visit | Attending: Medical | Admitting: Medical

## 2022-04-08 ENCOUNTER — Encounter: Payer: Self-pay | Admitting: Medical

## 2022-04-08 VITALS — BP 112/65 | HR 83 | Wt 178.0 lb

## 2022-04-08 DIAGNOSIS — F331 Major depressive disorder, recurrent, moderate: Secondary | ICD-10-CM

## 2022-04-08 DIAGNOSIS — R55 Syncope and collapse: Secondary | ICD-10-CM

## 2022-04-08 DIAGNOSIS — Z3480 Encounter for supervision of other normal pregnancy, unspecified trimester: Secondary | ICD-10-CM

## 2022-04-08 DIAGNOSIS — Z3A16 16 weeks gestation of pregnancy: Secondary | ICD-10-CM

## 2022-04-08 DIAGNOSIS — F339 Major depressive disorder, recurrent, unspecified: Secondary | ICD-10-CM

## 2022-04-08 NOTE — Progress Notes (Signed)
? ?  PRENATAL VISIT NOTE ? ?Subjective:  ?Paige Michael is a 31 y.o. [phone] at 61w2dbeing seen today for her first prenatal visit for this pregnancy.  She is currently monitored for the following issues for this low-risk pregnancy and has Moderate recurrent major depression ([place]; Supervision of other normal pregnancy, antepartum; and Vasovagal response on their problem list. ? ?Patient reports nausea and vomiting.  Contractions: Not present. Vag. Bleeding: None.  Movement: Present. Denies leaking of fluid.  ? ?She is planning to breastfeed. Desires NFP for contraception.  ? ?The following portions of the patient's history were reviewed and updated as appropriate: allergies, current medications, past family history, past medical history, past social history, past surgical history and problem list.  ? ?Objective:  ? ?Vitals:  ? 04/08/22 0951  ?BP: 112/65  ?Pulse: 83  ?Weight: 178 lb (80.7 kg)  ? ? ?Fetal Status: Fetal Heart Rate (bpm): 158   Movement: Present    ? ?General:  Alert, oriented and cooperative. Patient is in no acute distress.  ?Skin: Skin is warm and dry. No rash noted.   ?Cardiovascular: Normal heart rate and rhythm noted  ?Respiratory: Normal respiratory effort, no problems with respiration noted. Clear to auscultation.   ?Abdomen: Soft, gravid, appropriate for gestational age. Normal bowel sounds. Non-tender. Pain/Pressure: Absent     ?Pelvic: Cervical exam deferred        ?Extremities: Normal range of motion.  Edema: None  ?Mental Status: Normal mood and affect. Normal behavior. Normal judgment and thought content.  ? ?Assessment and Plan:  ?Pregnancy: [id number] 129w2d1. Supervision of other normal pregnancy, antepartum ?- AFP, Serum, Open Spina Bifida ?- CBC/D/Plt+RPR+Rh+ABO+RubIgG... ?- Hemoglobin A1c ?- Culture, OB Urine ?- Cervicovaginal ancillary only( [place]) ?- Unable to draw genetic testing today. Patient had a significant vasovagal reaction to her venipuncture resulting  in syncope. She opted not to attempt a re-draw and will consider if she wants to attempt in the future following anatomy USKoreacheduled for 04/27/22 ?- Pap smear normal 2021 in Epic  ? ?2. Recurrent depression ([place]?- [place]ulatory referral to [place] ?3. Vasovagal response ?- Patient states this is common with any needles ? ?4. [redacted] weeks gestation of pregnancy ? ? ?Preterm labor/second trimester warning symptoms and general obstetric precautions including but not limited to vaginal bleeding, contractions, leaking of fluid and fetal movement were reviewed in detail with the patient. ?Please refer to After Visit Summary for other counseling recommendations.  ? ?Discussed the normal visit cadence for prenatal care ?Discussed the nature of our practice with multiple providers including residents and students  ? ?Return in about 4 weeks (around 05/06/2022) for LOB, In-Person, Midwife preferred. ? ?Future Appointments  ?Date Time Provider [place]?04/27/2022 10:30 AM [facility] NURSE [facility]  ?04/27/2022 10:45 AM [facility] US5 [facility]US [facility]  ?05/04/2022  8:15 AM NeCaren MacadamMD [facility]? ? ?JuKerry HoughPA-C ? ?

## 2022-04-08 NOTE — Progress Notes (Signed)
Patient had syncopal episode immediately post blood draw BP 121/78 pulse 74 ?

## 2022-04-08 NOTE — Patient Instructions (Signed)
Safe Medications in Pregnancy   Acne:  Benzoyl Peroxide  Salicylic Acid   Backache/Headache:  Tylenol: 2 regular strength every 4 hours OR               2 Extra strength every 6 hours   Colds/Coughs/Allergies:  Benadryl (alcohol free) 25 mg every 6 hours as needed  Breath right strips  Claritin  Cepacol throat lozenges  Chloraseptic throat spray  Cold-Eeze- up to three times per day  Cough drops, alcohol free  Flonase (by prescription only)  Guaifenesin  Mucinex  Robitussin DM (plain only, alcohol free)  Saline nasal spray/drops  Sudafed (pseudoephedrine) & Actifed * use only after [redacted] weeks gestation and if you do not have high blood pressure  Tylenol  Vicks Vaporub  Zinc lozenges  Zyrtec   Constipation:  Colace  Ducolax suppositories  Fleet enema  Glycerin suppositories  Metamucil  Milk of magnesia  Miralax  Senokot  Smooth move tea   Diarrhea:  Kaopectate  Imodium A-D   *NO pepto Bismol   Hemorrhoids:  Anusol  Anusol HC  Preparation H  Tucks   Indigestion:  Tums  Maalox  Mylanta  Zantac  Pepcid   Insomnia:  Benadryl (alcohol free) 25mg every 6 hours as needed  Tylenol PM  Unisom, no Gelcaps   Leg Cramps:  Tums  MagGel   Nausea/Vomiting:  Bonine  Dramamine  Emetrol  Ginger extract  Sea bands  Meclizine  Nausea medication to take during pregnancy:  Unisom (doxylamine succinate 25 mg tablets) Take one tablet daily at bedtime. If symptoms are not adequately controlled, the dose can be increased to a maximum recommended dose of two tablets daily (1/2 tablet in the morning, 1/2 tablet mid-afternoon and one at bedtime).  Vitamin B6 100mg tablets. Take one tablet twice a day (up to 200 mg per day).   Skin Rashes:  Aveeno products  Benadryl cream or 25mg every 6 hours as needed  Calamine Lotion  1% cortisone cream   Yeast infection:  Gyne-lotrimin 7  Monistat 7    **If taking multiple medications, please check labels to avoid  duplicating the same active ingredients  **take medication as directed on the label  ** Do not exceed 4000 mg of tylenol in 24 hours  **Do not take medications that contain aspirin or ibuprofen         Considering Waterbirth? Guide for patients at [facility] ([facility]) Why consider waterbirth? Gentle birth for babies  Less pain medicine used in labor  May allow for passive descent/less pushing  May reduce perineal tears  More mobility and instinctive maternal position changes  Increased maternal relaxation   Is waterbirth safe? What are the risks of infection, drowning or other complications? Infection:  Very low risk (3.7 % for tub vs 4.8% for bed)  7 in 8000 waterbirths with documented infection  Poorly cleaned equipment most common cause  Slightly lower group B strep transmission rate  Drowning  Maternal:  Very low risk  Related to seizures or fainting  Newborn:  Very low risk. No evidence of increased risk of respiratory problems in multiple large studies  Physiological protection from breathing under water  Avoid underwater birth if there are any fetal complications  Once baby's head is out of the water, keep it out.  Birth complication  Some reports of cord trauma, but risk decreased by bringing baby to surface gradually  No evidence of increased risk of shoulder dystocia. Mothers can usually change   positions faster in water than in a bed, possibly aiding the maneuvers to free the shoulder.   There are 2 things you MUST do to have a waterbirth with [facility]: Attend a waterbirth class at [facility] [place]   3rd Wednesday of every month from 7-9 pm (virtual during COVID) Free Register online at [email or web address] or [email or web address] or by calling [phone] Bring us the certificate from the class to your prenatal appointment or send via MyChart Meet with a midwife at 36 weeks* to see if you can still plan a waterbirth and  to sign the consent.   *We also recommend that you schedule as many of your prenatal visits with a midwife as possible.    Helpful information: You may want to bring a bathing suit top to the hospital to wear during labor but this is optional.  All other supplies are provided by the hospital. Please arrive at the hospital with signs of active labor, and do not wait at home until late in labor. It takes 45 min- 1 hour for fetal monitoring, and check in to your room to take place, plus transport and filling of the waterbirth tub.    Things that would prevent you from having a waterbirth: Premature, <37wks  Previous cesarean birth  Presence of thick meconium-stained fluid  Multiple gestation (Twins, triplets, etc.)  Uncontrolled diabetes or gestational diabetes requiring medication  Hypertension diagnosed in pregnancy or preexisting hypertension (gestational hypertension, preeclampsia, or chronic hypertension) Fetal growth restriction (your baby measures less than 10th percentile on ultrasound) Heavy vaginal bleeding  Non-reassuring fetal heart rate  Active infection (MRSA, etc.). Group B Strep is NOT a contraindication for waterbirth.  If your labor has to be induced and induction method requires continuous monitoring of the baby's heart rate  Other risks/issues identified by your obstetrical provider   Please remember that birth is unpredictable. Under certain unforeseeable circumstances your provider may advise against giving birth in the tub. These decisions will be made on a case-by-case basis and with the safety of you and your baby as our highest priority.    Updated 03/16/22     

## 2022-04-09 LAB — CBC/D/PLT+RPR+RH+ABO+RUBIGG...
Antibody Screen: NEGATIVE
Basophils Absolute: 0 10*3/uL (ref 0.0–0.2)
Basos: 0 %
EOS (ABSOLUTE): 0.1 10*3/uL (ref 0.0–0.4)
Eos: 1 %
HCV Ab: NONREACTIVE
HIV Screen 4th Generation wRfx: NONREACTIVE
Hematocrit: 39.8 % (ref 34.0–46.6)
Hemoglobin: 13 g/dL (ref 11.1–15.9)
Hepatitis B Surface Ag: NEGATIVE
Immature Grans (Abs): 0.1 10*3/uL (ref 0.0–0.1)
Immature Granulocytes: 1 %
Lymphocytes Absolute: 1.8 10*3/uL (ref 0.7–3.1)
Lymphs: 24 %
MCH: 30 pg (ref 26.6–33.0)
MCHC: 32.7 g/dL (ref 31.5–35.7)
MCV: 92 fL (ref 79–97)
Monocytes Absolute: 0.5 10*3/uL (ref 0.1–0.9)
Monocytes: 7 %
Neutrophils Absolute: 5.1 10*3/uL (ref 1.4–7.0)
Neutrophils: 67 %
Platelets: 296 10*3/uL (ref 150–450)
RBC: 4.34 x10E6/uL (ref 3.77–5.28)
RDW: 13 % (ref 11.7–15.4)
RPR Ser Ql: NONREACTIVE
Rh Factor: POSITIVE
Rubella Antibodies, IGG: 2.57 index (ref >0.99)
WBC: 7.5 10*3/uL (ref 3.4–10.8)

## 2022-04-09 LAB — HCV INTERPRETATION

## 2022-04-09 LAB — HEMOGLOBIN A1C
Est. average glucose Bld gHb Est-mCnc: 97 mg/dL
Hgb A1c MFr Bld: 5 % (ref 4.8–5.6)

## 2022-04-09 LAB — AFP, SERUM, OPEN SPINA BIFIDA
AFP MoM: 0.98
AFP Value: 31.1 ng/mL
Gest. Age on Collection Date: 16.2 weeks
Maternal Age At EDD: 32.5 yr
OSBR Risk 1 IN: 10000
Test Results:: NEGATIVE
Weight: 178 [lb_av]

## 2022-04-10 LAB — CULTURE, OB URINE

## 2022-04-10 LAB — URINE CULTURE, OB REFLEX

## 2022-04-12 LAB — CERVICOVAGINAL ANCILLARY ONLY
Chlamydia: NEGATIVE
Comment: NEGATIVE
Comment: NORMAL
Neisseria Gonorrhea: NEGATIVE

## 2022-04-27 ENCOUNTER — Ambulatory Visit: Payer: No Typology Code available for payment source | Attending: Medical

## 2022-04-27 ENCOUNTER — Encounter: Payer: Self-pay | Admitting: *Deleted

## 2022-04-27 ENCOUNTER — Ambulatory Visit: Payer: No Typology Code available for payment source | Admitting: *Deleted

## 2022-04-27 ENCOUNTER — Other Ambulatory Visit: Payer: Self-pay | Admitting: *Deleted

## 2022-04-27 VITALS — BP 112/57 | HR 70

## 2022-04-27 DIAGNOSIS — Z3689 Encounter for other specified antenatal screening: Secondary | ICD-10-CM

## 2022-04-27 DIAGNOSIS — Z3480 Encounter for supervision of other normal pregnancy, unspecified trimester: Secondary | ICD-10-CM | POA: Diagnosis not present

## 2022-04-27 DIAGNOSIS — Z362 Encounter for other antenatal screening follow-up: Secondary | ICD-10-CM

## 2022-04-27 DIAGNOSIS — Z363 Encounter for antenatal screening for malformations: Secondary | ICD-10-CM | POA: Insufficient documentation

## 2022-04-27 DIAGNOSIS — Z3A19 19 weeks gestation of pregnancy: Secondary | ICD-10-CM | POA: Diagnosis not present

## 2022-05-04 ENCOUNTER — Encounter: Payer: Self-pay | Admitting: Family Medicine

## 2022-05-04 ENCOUNTER — Ambulatory Visit (INDEPENDENT_AMBULATORY_CARE_PROVIDER_SITE_OTHER): Payer: No Typology Code available for payment source | Admitting: Family Medicine

## 2022-05-04 VITALS — BP 106/67 | HR 72 | Wt 176.5 lb

## 2022-05-04 DIAGNOSIS — Z3480 Encounter for supervision of other normal pregnancy, unspecified trimester: Secondary | ICD-10-CM

## 2022-05-04 DIAGNOSIS — F331 Major depressive disorder, recurrent, moderate: Secondary | ICD-10-CM

## 2022-05-04 DIAGNOSIS — R55 Syncope and collapse: Secondary | ICD-10-CM

## 2022-05-04 NOTE — Progress Notes (Signed)
   PRENATAL VISIT NOTE  Subjective:  Paige Michael is a 31 y.o. [phone] at 50w0dbeing seen today for ongoing prenatal care.  She is currently monitored for the following issues for this low-risk pregnancy and has Moderate recurrent major depression (HYak[place]; Supervision of other normal pregnancy, antepartum; and Vasovagal response on their problem list.  Patient reports no complaints.  Contractions: Not present. Vag. Bleeding: None.  Movement: Present. Denies leaking of fluid.   The following portions of the patient's history were reviewed and updated as appropriate: allergies, current medications, past family history, past medical history, past social history, past surgical history and problem list.   Objective:   Vitals:   05/04/22 0825  BP: 106/67  Pulse: 72  Weight: 176 lb 8 oz (80.1 kg)    Fetal Status: Fetal Heart Rate (bpm): 146 Fundal Height: 20 cm Movement: Present     General:  Alert, oriented and cooperative. Patient is in no acute distress.  Skin: Skin is warm and dry. No rash noted.   Cardiovascular: Normal heart rate noted  Respiratory: Normal respiratory effort, no problems with respiration noted  Abdomen: Soft, gravid, appropriate for gestational age.  Pain/Pressure: Absent     Pelvic: Cervical exam deferred        Extremities: Normal range of motion.  Edema: None  Mental Status: Normal mood and affect. Normal behavior. Normal judgment and thought content.   Assessment and Plan:  Pregnancy: G4P3003 at 239w0d. Supervision of other normal pregnancy, antepartum Reviewed plan for WB, has given birth x3 without medication including home birth. Labored in tub with last delivery but then exited at 9cm due to stalling and gave birth on land Took class in 2021  - Pt interested in waterbirth and has attended the class.  - Reviewed conditions in labor that will risk her out of water immersion including thick meconium or blood stained amniotic fluid, non-reassuring  fetal status on monitor, excessive bleeding, hypertension, dizziness, use of IV meds, damaged equipment or staffing that does not allow for water immersion, etc.  - The attending midwife must be on the unit for water immersion to begin; pt understands this may delay the start of water immersion. - Reminded pt that signing consent in labor at the hospital also acknowledges they will exit the tub if the attending midwife requests. - Consent given to patient for review.  Consent will be reviewed and signed at the hospital by the waterbirth provider prior to use of the tub. - Discussed other labor support options if waterbirth becomes unavailable, including position change, freedom of movement, use of birthing ball, and/or use of hydrotherapy in the shower (dependent upon medical condition/provider discretion).   2. Moderate recurrent major depression (HCPondsvilleReviewed sx in the past. Stable now and patient very aware of triggers and has better support her [place]  Preterm labor symptoms and general obstetric precautions including but not limited to vaginal bleeding, contractions, leaking of fluid and fetal movement were reviewed in detail with the patient. Please refer to After Visit Summary for other counseling recommendations.   Return in about 4 weeks (around 06/01/2022) for Routine prenatal care.  Future Appointments  Date Time Provider DeL[place]6/15/2023 10:15 AM WMC-MFC NURSE WMAdventist [facility] ValleyMLa[facility] [place]6/15/2023 10:30 AM WMC-MFC US3 WMC-MFCUS WMLawrence [facility]6/21/2023  2:55 PM WaGabriel CarinaCNM WMEdith Nourse Rogers Memorial [facility] HospitalMWest [facility]  KiCaren MacadamMD

## 2022-05-18 ENCOUNTER — Telehealth: Payer: Self-pay | Admitting: Licensed Clinical Social Worker

## 2022-05-18 NOTE — Telephone Encounter (Signed)
Called pt regarding IBH referral unable to leave message due to call can 't be completed as dialed

## 2022-05-26 ENCOUNTER — Other Ambulatory Visit: Payer: Self-pay | Admitting: *Deleted

## 2022-05-26 ENCOUNTER — Ambulatory Visit: Payer: No Typology Code available for payment source | Admitting: *Deleted

## 2022-05-26 ENCOUNTER — Ambulatory Visit: Payer: No Typology Code available for payment source | Attending: Maternal & Fetal Medicine

## 2022-05-26 ENCOUNTER — Encounter: Payer: Self-pay | Admitting: *Deleted

## 2022-05-26 VITALS — BP 113/61 | HR 78

## 2022-05-26 DIAGNOSIS — Z362 Encounter for other antenatal screening follow-up: Secondary | ICD-10-CM | POA: Diagnosis not present

## 2022-05-26 DIAGNOSIS — Z3689 Encounter for other specified antenatal screening: Secondary | ICD-10-CM

## 2022-05-26 DIAGNOSIS — Z3A23 23 weeks gestation of pregnancy: Secondary | ICD-10-CM | POA: Diagnosis not present

## 2022-06-01 ENCOUNTER — Other Ambulatory Visit: Payer: Self-pay

## 2022-06-01 ENCOUNTER — Ambulatory Visit (INDEPENDENT_AMBULATORY_CARE_PROVIDER_SITE_OTHER): Payer: No Typology Code available for payment source | Admitting: Certified Nurse Midwife

## 2022-06-01 VITALS — BP 117/77 | HR 82 | Wt 182.9 lb

## 2022-06-01 DIAGNOSIS — Z3A24 24 weeks gestation of pregnancy: Secondary | ICD-10-CM

## 2022-06-01 DIAGNOSIS — Z3492 Encounter for supervision of normal pregnancy, unspecified, second trimester: Secondary | ICD-10-CM

## 2022-06-01 NOTE — Progress Notes (Signed)
   PRENATAL VISIT NOTE  Subjective:  Paige Michael is a 31 y.o. [phone] at 7w0dbeing seen today for ongoing prenatal care.  She is currently monitored for the following issues for this low-risk pregnancy and has Moderate recurrent major depression (HCenter; Supervision of other normal pregnancy, antepartum; and Vasovagal response on their problem list.  Patient reports no complaints.  Contractions: Not present. Vag. Bleeding: None.  Movement: Present. Denies leaking of fluid.   The following portions of the patient's history were reviewed and updated as appropriate: allergies, current medications, past family history, past medical history, past social history, past surgical history and problem list.   Objective:   Vitals:   06/01/22 1511  BP: 117/77  Pulse: 82  Weight: 182 lb 14.4 oz (83 kg)    Fetal Status: Fetal Heart Rate (bpm): 152 Fundal Height: 24 cm Movement: Present     General:  Alert, oriented and cooperative. Patient is in no acute distress.  Skin: Skin is warm and dry. No rash noted.   Cardiovascular: Normal heart rate noted  Respiratory: Normal respiratory effort, no problems with respiration noted  Abdomen: Soft, gravid, appropriate for gestational age.  Pain/Pressure: Absent     Pelvic: Cervical exam deferred        Extremities: Normal range of motion.  Edema: None  Mental Status: Normal mood and affect. Normal behavior. Normal judgment and thought content.   Assessment and Plan:  Pregnancy: G4P3003 at 255w0d. Encounter for supervision of low-risk pregnancy in second trimester - Doing well, feeling regular and vigorous fetal movement   2. [redacted] weeks gestation of pregnancy - Routine OB care  - Reassured pt that her weight gain has been appropriate and discussed healthy eating habits in pregnancy that help support her metabolism without being too restrictive  Preterm labor symptoms and general obstetric precautions including but not limited to vaginal  bleeding, contractions, leaking of fluid and fetal movement were reviewed in detail with the patient. Please refer to After Visit Summary for other counseling recommendations.   Return in about 2 weeks (around 06/15/2022) for LOB/GTT.  Future Appointments  Date Time Provider De[place]7/06/2022  1:30 PM WMMartel [facility] Institute LLCURSE WMSan Gorgonio [facility] HospitalMAdv[facility]7/06/2022  1:45 PM WMC-MFC US6 WMC-MFCUS WMAlliance Health System7/17/2023  1:35 PM LeSimona HuhMOakland Physican Surgery CenterMOtis R [facility]7/17/2023  2:10 PM WMC-WOCA LAB WMMeridianMFairwood  JaGabriel CarinaCNM

## 2022-06-17 ENCOUNTER — Ambulatory Visit: Payer: No Typology Code available for payment source | Attending: Obstetrics

## 2022-06-17 ENCOUNTER — Other Ambulatory Visit: Payer: Self-pay | Admitting: Maternal & Fetal Medicine

## 2022-06-17 ENCOUNTER — Ambulatory Visit: Payer: No Typology Code available for payment source | Admitting: *Deleted

## 2022-06-17 VITALS — BP 112/64 | HR 92

## 2022-06-17 DIAGNOSIS — Z362 Encounter for other antenatal screening follow-up: Secondary | ICD-10-CM | POA: Diagnosis not present

## 2022-06-17 DIAGNOSIS — Z3480 Encounter for supervision of other normal pregnancy, unspecified trimester: Secondary | ICD-10-CM | POA: Insufficient documentation

## 2022-06-17 DIAGNOSIS — Z3689 Encounter for other specified antenatal screening: Secondary | ICD-10-CM

## 2022-06-17 DIAGNOSIS — O365920 Maternal care for other known or suspected poor fetal growth, second trimester, not applicable or unspecified: Secondary | ICD-10-CM

## 2022-06-17 DIAGNOSIS — Z3A26 26 weeks gestation of pregnancy: Secondary | ICD-10-CM | POA: Insufficient documentation

## 2022-06-20 ENCOUNTER — Other Ambulatory Visit: Payer: Self-pay | Admitting: *Deleted

## 2022-06-20 DIAGNOSIS — O365920 Maternal care for other known or suspected poor fetal growth, second trimester, not applicable or unspecified: Secondary | ICD-10-CM

## 2022-06-27 ENCOUNTER — Ambulatory Visit (INDEPENDENT_AMBULATORY_CARE_PROVIDER_SITE_OTHER): Payer: No Typology Code available for payment source | Admitting: Advanced Practice Midwife

## 2022-06-27 ENCOUNTER — Other Ambulatory Visit: Payer: Self-pay

## 2022-06-27 ENCOUNTER — Other Ambulatory Visit: Payer: No Typology Code available for payment source

## 2022-06-27 VITALS — BP 118/82 | HR 83 | Wt 191.2 lb

## 2022-06-27 DIAGNOSIS — Z3480 Encounter for supervision of other normal pregnancy, unspecified trimester: Secondary | ICD-10-CM

## 2022-06-27 DIAGNOSIS — R55 Syncope and collapse: Secondary | ICD-10-CM

## 2022-06-27 DIAGNOSIS — Z3492 Encounter for supervision of normal pregnancy, unspecified, second trimester: Secondary | ICD-10-CM

## 2022-06-27 DIAGNOSIS — O365920 Maternal care for other known or suspected poor fetal growth, second trimester, not applicable or unspecified: Secondary | ICD-10-CM

## 2022-06-27 DIAGNOSIS — Z23 Encounter for immunization: Secondary | ICD-10-CM | POA: Diagnosis not present

## 2022-06-27 DIAGNOSIS — Z3A27 27 weeks gestation of pregnancy: Secondary | ICD-10-CM

## 2022-06-27 NOTE — Progress Notes (Signed)
   PRENATAL VISIT NOTE  Subjective:  Paige Michael is a 31 y.o. [phone] at 72w5dbeing seen today for ongoing prenatal care.  She is currently monitored for the following issues for this low-risk pregnancy and has Moderate recurrent major depression (HTyn[employer]; Supervision of other normal pregnancy, antepartum; and Vasovagal response on their problem list.  Patient reports no complaints.  Contractions: Not present. Vag. Bleeding: None.  Movement: Present. Denies leaking of fluid.   The following portions of the patient's history were reviewed and updated as appropriate: allergies, current medications, past family history, past medical history, past social history, past surgical history and problem list.   Objective:   Vitals:   06/27/22 1346  BP: 118/82  Pulse: 83  Weight: 191 lb 3.2 oz (86.7 kg)    Fetal Status: Fetal Heart Rate (bpm): 137   Movement: Present     General:  Alert, oriented and cooperative. Patient is in no acute distress.  Skin: Skin is warm and dry. No rash noted.   Cardiovascular: Normal heart rate noted  Respiratory: Normal respiratory effort, no problems with respiration noted  Abdomen: Soft, gravid, appropriate for gestational age.  Pain/Pressure: Absent     Pelvic: Cervical exam deferred        Extremities: Normal range of motion.  Edema: None  Mental Status: Normal mood and affect. Normal behavior. Normal judgment and thought content.   Assessment and Plan:  Pregnancy: G4P3003 at 235w5d. Encounter for supervision of low-risk pregnancy in second trimester --Anticipatory guidance about next visits/weeks of pregnancy given.   2. [redacted] weeks gestation of pregnancy   3. Vasovagal syncope --Lab draw today--discuss with pt, reassurance  4. Poor fetal growth affecting management of mother in second trimester, single or unspecified fetus --EFW  9% on 06/17/22, growth in 4 weeks --Discussed FGR and waterbirth today, if size remains less than 10% she will  not be a candidate for waterbirth. If size is above 10%, MFM may still recommend IOL and we can discuss this as needed.  Also provider discretion may mean some providers do not feel comfortable with pt in tub after previous dx of growth restriction, even if it resolves.  Pt states understanding and will keep her scheduled USKoreand discuss more at later time.   Preterm labor symptoms and general obstetric precautions including but not limited to vaginal bleeding, contractions, leaking of fluid and fetal movement were reviewed in detail with the patient. Please refer to After Visit Summary for other counseling recommendations.   Return in about 2 weeks (around 07/11/2022) for Midwife preferred.  Future Appointments  Date Time Provider DeNorth [place]7/24/2023  1:30 PM WMUnited Medical Rehabilitation HospitalURSE WMRaleigh Endoscopy Center NorthMPiccard Surgery Center LLC7/24/2023  1:45 PM WMC-MFC US4 WMC-MFCUS WM[facility]7/24/2023  3:15 PM WMC-MFC NST WMC-MFC WMSouthwood [facility]7/31/2023  1:30 PM WMC-MFC NURSE WMC-MFC WMUnm Sandoval Regional [facility]7/31/2023  1:45 PM WMC-MFC US4 WMC-MFCUS WMChrist[facility]7/31/2023  3:15 PM WMC-MFC NST WMC-MFC WMValley [facility]al [facility]8/02/2022  9:15 AM WaHelaine ChessMCross Road Medical CenterMAscension [facility]8/06/2022  2:30 PM WMC-MFC NURSE WMC-MFC WMBayne[facility]Jones Army [facility]8/06/2022  2:45 PM WMC-MFC US5 WMC-MFCUS WMProvidence Mount Carm[facility]8/06/2022  4:00 PM WMC-MFC NST WMC-MFC WMChase  LiFatima BlankCNM

## 2022-06-28 LAB — HIV ANTIBODY (ROUTINE TESTING W REFLEX): HIV Screen 4th Generation wRfx: NONREACTIVE

## 2022-06-28 LAB — CBC
Hematocrit: 36.9 % (ref 34.0–46.6)
Hemoglobin: 12.4 g/dL (ref 11.1–15.9)
MCH: 30.5 pg (ref 26.6–33.0)
MCHC: 33.6 g/dL (ref 31.5–35.7)
MCV: 91 fL (ref 79–97)
Platelets: 259 10*3/uL (ref 150–450)
RBC: 4.07 x10E6/uL (ref 3.77–5.28)
RDW: 12.2 % (ref 11.7–15.4)
WBC: 13.6 10*3/uL — ABNORMAL HIGH (ref 3.4–10.8)

## 2022-06-28 LAB — RPR: RPR Ser Ql: NONREACTIVE

## 2022-06-28 LAB — GLUCOSE TOLERANCE, 1 HOUR: Glucose, 1Hr PP: 95 mg/dL (ref 70–199)

## 2022-07-04 ENCOUNTER — Ambulatory Visit: Payer: No Typology Code available for payment source | Attending: Obstetrics

## 2022-07-04 ENCOUNTER — Encounter: Payer: Self-pay | Admitting: *Deleted

## 2022-07-04 ENCOUNTER — Ambulatory Visit (HOSPITAL_BASED_OUTPATIENT_CLINIC_OR_DEPARTMENT_OTHER): Payer: No Typology Code available for payment source | Admitting: *Deleted

## 2022-07-04 ENCOUNTER — Ambulatory Visit: Payer: No Typology Code available for payment source | Admitting: *Deleted

## 2022-07-04 VITALS — BP 119/69 | HR 96

## 2022-07-04 DIAGNOSIS — Z3A28 28 weeks gestation of pregnancy: Secondary | ICD-10-CM | POA: Diagnosis not present

## 2022-07-04 DIAGNOSIS — O365930 Maternal care for other known or suspected poor fetal growth, third trimester, not applicable or unspecified: Secondary | ICD-10-CM | POA: Insufficient documentation

## 2022-07-04 DIAGNOSIS — O321XX0 Maternal care for breech presentation, not applicable or unspecified: Secondary | ICD-10-CM | POA: Diagnosis not present

## 2022-07-04 DIAGNOSIS — Z3480 Encounter for supervision of other normal pregnancy, unspecified trimester: Secondary | ICD-10-CM | POA: Insufficient documentation

## 2022-07-04 DIAGNOSIS — Z362 Encounter for other antenatal screening follow-up: Secondary | ICD-10-CM | POA: Diagnosis not present

## 2022-07-04 DIAGNOSIS — O365920 Maternal care for other known or suspected poor fetal growth, second trimester, not applicable or unspecified: Secondary | ICD-10-CM | POA: Insufficient documentation

## 2022-07-04 NOTE — Procedures (Signed)
Nahal Wanless Hartinger July 01, 1991 [redacted]w[redacted]d Fetus A Non-Stress Test Interpretation for 07/04/22  Indication: IUGR  Fetal Heart Rate A Mode: External Baseline Rate (A): 130 bpm Variability: Moderate Accelerations: 10 x 10 Decelerations: None Multiple birth?: No  Uterine Activity Mode: Palpation, Toco Contraction Frequency (min): none Resting Tone Palpated: Relaxed  Interpretation (Fetal Testing) Nonstress Test Interpretation: Reactive Overall Impression: Reassuring for gestational age Comments: Dr. FAnnamaria Bootsreviewed tracing

## 2022-07-11 ENCOUNTER — Ambulatory Visit (HOSPITAL_BASED_OUTPATIENT_CLINIC_OR_DEPARTMENT_OTHER): Payer: No Typology Code available for payment source | Admitting: *Deleted

## 2022-07-11 ENCOUNTER — Encounter: Payer: Self-pay | Admitting: *Deleted

## 2022-07-11 ENCOUNTER — Other Ambulatory Visit: Payer: Self-pay | Admitting: *Deleted

## 2022-07-11 ENCOUNTER — Ambulatory Visit: Payer: No Typology Code available for payment source | Attending: Obstetrics

## 2022-07-11 ENCOUNTER — Ambulatory Visit: Payer: No Typology Code available for payment source | Admitting: *Deleted

## 2022-07-11 ENCOUNTER — Other Ambulatory Visit: Payer: Self-pay | Admitting: Obstetrics

## 2022-07-11 VITALS — BP 115/64 | HR 71

## 2022-07-11 DIAGNOSIS — O365930 Maternal care for other known or suspected poor fetal growth, third trimester, not applicable or unspecified: Secondary | ICD-10-CM | POA: Insufficient documentation

## 2022-07-11 DIAGNOSIS — O365920 Maternal care for other known or suspected poor fetal growth, second trimester, not applicable or unspecified: Secondary | ICD-10-CM

## 2022-07-11 DIAGNOSIS — Z3A29 29 weeks gestation of pregnancy: Secondary | ICD-10-CM

## 2022-07-11 DIAGNOSIS — O365990 Maternal care for other known or suspected poor fetal growth, unspecified trimester, not applicable or unspecified: Secondary | ICD-10-CM

## 2022-07-11 DIAGNOSIS — Z3480 Encounter for supervision of other normal pregnancy, unspecified trimester: Secondary | ICD-10-CM

## 2022-07-11 NOTE — Procedures (Signed)
Paige Michael [birth year] [redacted]w[redacted]d Fetus A Non-Stress Test Interpretation for 07/11/22  Indication: IUGR  Fetal Heart Rate A Mode: External Baseline Rate (A): 135 bpm Variability: Moderate Accelerations: 15 x 15 Decelerations: None Multiple birth?: No  Uterine Activity Mode: Palpation, Toco Contraction Frequency (min): none Resting Tone Palpated: Relaxed  Interpretation (Fetal Testing) Nonstress Test Interpretation: Reactive Overall Impression: Reassuring for gestational age Comments: Dr. SDonalee Citrinreviewed tracing

## 2022-07-14 ENCOUNTER — Ambulatory Visit (INDEPENDENT_AMBULATORY_CARE_PROVIDER_SITE_OTHER): Payer: No Typology Code available for payment source | Admitting: Certified Nurse Midwife

## 2022-07-14 ENCOUNTER — Other Ambulatory Visit: Payer: Self-pay

## 2022-07-14 VITALS — BP 104/71 | HR 89 | Wt 193.8 lb

## 2022-07-14 DIAGNOSIS — Z3493 Encounter for supervision of normal pregnancy, unspecified, third trimester: Secondary | ICD-10-CM

## 2022-07-14 DIAGNOSIS — Z3A30 30 weeks gestation of pregnancy: Secondary | ICD-10-CM

## 2022-07-14 DIAGNOSIS — O365990 Maternal care for other known or suspected poor fetal growth, unspecified trimester, not applicable or unspecified: Secondary | ICD-10-CM

## 2022-07-15 NOTE — Progress Notes (Signed)
PRENATAL VISIT NOTE  Subjective:  Paige Michael is a 31 y.o. [phone] at 78w2dbeing seen today for ongoing prenatal care.  She is currently monitored for the following issues for this high-risk pregnancy and has Moderate recurrent major depression (HSelma; Supervision of other normal pregnancy, antepartum; and Vasovagal response on their problem list.  Patient reports  heightened anxiety due to FGR with likely IOL at 37wks or earlier. Is sad she will not get a waterbirth, afraid of a highly medicalized birth without movement etc, and frustrated that her body is responding to the pregnancy in this way .  Contractions: Not present. Vag. Bleeding: None.  Movement: Present. Denies leaking of fluid.   The following portions of the patient's history were reviewed and updated as appropriate: allergies, current medications, past family history, past medical history, past social history, past surgical history and problem list.   Objective:   Vitals:   07/14/22 0922  BP: 104/71  Pulse: 89  Weight: 193 lb 12.8 oz (87.9 kg)    Fetal Status: Fetal Heart Rate (bpm): 135   Movement: Present     General:  Alert, oriented and cooperative. Patient is in no acute distress.  Skin: Skin is warm and dry. No rash noted.   Cardiovascular: Normal heart rate noted  Respiratory: Normal respiratory effort, no problems with respiration noted  Abdomen: Soft, gravid, appropriate for gestational age.  Pain/Pressure: Present     Pelvic: Cervical exam deferred        Extremities: Normal range of motion.  Edema: None  Mental Status: Normal mood and affect. Normal behavior. Normal judgment and thought content.   Assessment and Plan:  Pregnancy: G4P3003 at 310w2d. Encounter for supervision of low-risk pregnancy in third trimester - Doing well, feeling regular and vigorous fetal movement   2. [redacted] weeks gestation of pregnancy - Routine OB care  3. Fetal growth restriction - Copious reassurance given -  reminded pt that even with an IOL she can set up the birth room how she wants (lights, aromatherapy etc) and even in the case of a CS, she is an excellent candidate for a VBAC. Pt visibly calmed as we discussed. - Discussed ways to improve her placental health including dietary changes and stress modulation  Preterm labor symptoms and general obstetric precautions including but not limited to vaginal bleeding, contractions, leaking of fluid and fetal movement were reviewed in detail with the patient. Please refer to After Visit Summary for other counseling recommendations.   Return in about 2 weeks (around 07/28/2022) for IN-PERSON, LOB.  Future Appointments  Date Time Provider DeMore[place]8/06/2022  2:30 PM WMSelect Specialty Hospital Gulf CoastURSE WMC-MFC WMVibra Hospital [facility]8/06/2022  2:45 PM WMC-MFC US5 WMC-MFCUS WMChi Health St. Elizabeth8/06/2022  4:00 PM WMC-MFC NST WMC-MFC WMMercy Hospital Paris8/15/2023 10:15 AM WMC-MFC NURSE WMC-MFC WMCl[facility]8/15/2023 10:30 AM WMC-MFC US2 WMC-MFCUS WMIntegris Miami Hospital8/15/2023 12:15 PM WMC-MFC NST WMC-MFC WMHarney District Hospital8/16/2023  9:15 AM WaGabriel CarinaCNM WMTurquoise Lodge HospitalMBenson Hospital8/22/2023  1:45 PM WMC-MFC NURSE WMC-MFC WMMayfair Digestive [facility]8/22/2023  2:00 PM WMC-MFC US1 WMC-MFCUS WMMercy Hospital [facility]o8/22/2023  3:15 PM WMC-MFC NST WMC-MFC WMSaint Francis Gi [facility]8/28/2023  2:15 PM WMC-MFC NURSE WMC-MFC WMConway Regional Medical [facility]8/28/2023  2:30 PM WMC-MFC US3 WMC-MFCUS WMRegional Hospital [facility]on8/28/2023  4:00 PM WMC-MFC NST WMC-MFC WMVibra Hospital Of Richmond LLC8/31/2023  4:15 PM KoStarr LakeCNM WMTripler Army Medical CenterMMclaren [facility]9/06/2022  4:15 PM KoStarr LakeCNM WMThayer County Health ServicesMNew [facility]9/13/2023  2:35 PM WaGabriel CarinaCNM WMVal Verde Regional Medical CenterMAnderson [facility]oscopy [facility]9/20/2023  3:15 PM Helaine Chess [facility]  09/07/2022  1:35 PM Helaine Chess [facility]  09/14/2022  3:15 PM Helaine Chess [facility]  09/21/2022  1:35 PM Helaine Chess [facility]  09/28/2022  1:15 PM WMC-WOCA NST  [facility]  09/28/2022  2:15 PM Gabriel Carina, CNM WMC-CWH [facility]    Gabriel Carina, CNM

## 2022-07-18 ENCOUNTER — Ambulatory Visit: Payer: Medicaid Other | Attending: Obstetrics

## 2022-07-18 ENCOUNTER — Ambulatory Visit (HOSPITAL_BASED_OUTPATIENT_CLINIC_OR_DEPARTMENT_OTHER): Payer: Medicaid Other | Admitting: *Deleted

## 2022-07-18 ENCOUNTER — Encounter: Payer: Self-pay | Admitting: *Deleted

## 2022-07-18 ENCOUNTER — Ambulatory Visit: Payer: Medicaid Other | Admitting: *Deleted

## 2022-07-18 VITALS — BP 118/62 | HR 84

## 2022-07-18 DIAGNOSIS — Z3A30 30 weeks gestation of pregnancy: Secondary | ICD-10-CM | POA: Insufficient documentation

## 2022-07-18 DIAGNOSIS — O321XX0 Maternal care for breech presentation, not applicable or unspecified: Secondary | ICD-10-CM | POA: Diagnosis not present

## 2022-07-18 DIAGNOSIS — O365920 Maternal care for other known or suspected poor fetal growth, second trimester, not applicable or unspecified: Secondary | ICD-10-CM | POA: Insufficient documentation

## 2022-07-18 DIAGNOSIS — O365930 Maternal care for other known or suspected poor fetal growth, third trimester, not applicable or unspecified: Secondary | ICD-10-CM

## 2022-07-18 DIAGNOSIS — Z3480 Encounter for supervision of other normal pregnancy, unspecified trimester: Secondary | ICD-10-CM

## 2022-07-18 NOTE — Procedures (Signed)
Paige Michael [birth year] [redacted]w[redacted]d Fetus A Non-Stress Test Interpretation for 07/18/22  Indication: IUGR  Fetal Heart Rate A Mode: External Baseline Rate (A): 130 bpm Variability: Moderate Accelerations: 10 x 10 Decelerations: None Multiple birth?: No  Uterine Activity Mode: Palpation, Toco Contraction Frequency (min): none Resting Tone Palpated: Relaxed  Interpretation (Fetal Testing) Nonstress Test Interpretation: Reactive Overall Impression: Reassuring for gestational age Comments: Dr. FAnnamaria Bootsreviewed tracing

## 2022-07-26 ENCOUNTER — Encounter: Payer: Self-pay | Admitting: *Deleted

## 2022-07-26 ENCOUNTER — Ambulatory Visit: Payer: Medicaid Other | Admitting: *Deleted

## 2022-07-26 ENCOUNTER — Ambulatory Visit (HOSPITAL_BASED_OUTPATIENT_CLINIC_OR_DEPARTMENT_OTHER): Payer: Medicaid Other | Admitting: *Deleted

## 2022-07-26 ENCOUNTER — Ambulatory Visit: Payer: Medicaid Other | Attending: Obstetrics and Gynecology

## 2022-07-26 VITALS — BP 126/60 | HR 92

## 2022-07-26 DIAGNOSIS — Z3480 Encounter for supervision of other normal pregnancy, unspecified trimester: Secondary | ICD-10-CM

## 2022-07-26 DIAGNOSIS — O365931 Maternal care for other known or suspected poor fetal growth, third trimester, fetus 1: Secondary | ICD-10-CM

## 2022-07-26 DIAGNOSIS — Z3A31 31 weeks gestation of pregnancy: Secondary | ICD-10-CM | POA: Diagnosis not present

## 2022-07-26 DIAGNOSIS — O365990 Maternal care for other known or suspected poor fetal growth, unspecified trimester, not applicable or unspecified: Secondary | ICD-10-CM | POA: Diagnosis present

## 2022-07-26 DIAGNOSIS — O365930 Maternal care for other known or suspected poor fetal growth, third trimester, not applicable or unspecified: Secondary | ICD-10-CM

## 2022-07-26 NOTE — Procedures (Signed)
Paige Michael 1991-09-07 [redacted]w[redacted]d Fetus A Non-Stress Test Interpretation for 07/26/22  Indication: IUGR  Fetal Heart Rate A Mode: External Baseline Rate (A): 125 bpm Variability: Moderate Accelerations: 15 x 15 Decelerations: None Multiple birth?: No  Uterine Activity Mode: Toco Contraction Frequency (min): none Resting Tone Palpated: Relaxed  Interpretation (Fetal Testing) Nonstress Test Interpretation: Reactive Overall Impression: Reassuring for gestational age Comments: tracing reviewed by Dr. bGertie Exon

## 2022-07-27 ENCOUNTER — Ambulatory Visit (INDEPENDENT_AMBULATORY_CARE_PROVIDER_SITE_OTHER): Payer: Medicaid Other | Admitting: Certified Nurse Midwife

## 2022-07-27 ENCOUNTER — Other Ambulatory Visit: Payer: Self-pay

## 2022-07-27 VITALS — BP 104/60 | HR 68 | Wt 195.0 lb

## 2022-07-27 DIAGNOSIS — Z3A32 32 weeks gestation of pregnancy: Secondary | ICD-10-CM

## 2022-07-27 DIAGNOSIS — F5104 Psychophysiologic insomnia: Secondary | ICD-10-CM

## 2022-07-27 DIAGNOSIS — O365990 Maternal care for other known or suspected poor fetal growth, unspecified trimester, not applicable or unspecified: Secondary | ICD-10-CM

## 2022-07-27 DIAGNOSIS — Z3493 Encounter for supervision of normal pregnancy, unspecified, third trimester: Secondary | ICD-10-CM

## 2022-07-27 MED ORDER — MAGNESIUM OXIDE -MG SUPPLEMENT 200 MG PO TABS: 400.0000 mg | ORAL_TABLET | Freq: Every day | ORAL | 3 refills | Status: DC

## 2022-07-27 NOTE — Progress Notes (Signed)
   PRENATAL VISIT NOTE  Subjective:  Paige Michael is a 31 y.o. [phone] at 25w0dbeing seen today for ongoing prenatal care.  She is currently monitored for the following issues for this low-risk pregnancy and has Moderate recurrent major depression (HNewman; Supervision of other normal pregnancy, antepartum; Vasovagal response; and Fetal growth restriction antepartum on their problem list.  Patient reports no complaints.  Contractions: Not present. Vag. Bleeding: None.  Movement: Present. Denies leaking of fluid.   The following portions of the patient's history were reviewed and updated as appropriate: allergies, current medications, past family history, past medical history, past social history, past surgical history and problem list.   Objective:   Vitals:   07/27/22 0944  BP: 104/60  Pulse: 68  Weight: 195 lb (88.5 kg)    Fetal Status: Fetal Heart Rate (bpm): 147 Fundal Height: 32 cm Movement: Present     General:  Alert, oriented and cooperative. Patient is in no acute distress.  Skin: Skin is warm and dry. No rash noted.   Cardiovascular: Normal heart rate noted  Respiratory: Normal respiratory effort, no problems with respiration noted  Abdomen: Soft, gravid, appropriate for gestational age.  Pain/Pressure: Present     Pelvic: Cervical exam deferred        Extremities: Normal range of motion.  Edema: None  Mental Status: Normal mood and affect. Normal behavior. Normal judgment and thought content.   Assessment and Plan:  Pregnancy: G4P3003 at 346w0d. Encounter for supervision of low-risk pregnancy in third trimester - Doing well, feeling regular and vigorous fetal movement   2. [redacted] weeks gestation of pregnancy - Routine OB care   3. Fetal growth restriction antepartum - 4%ile at last visit, all BPP/NST good with normal Dopplers  4. Psychophysiological insomnia - Magnesium Oxide -Mg Supplement (MAG-OXIDE) 200 MG TABS; Take 2 tablets (400 mg total) by mouth at  bedtime. If that amount causes loose stools in the am, switch to '200mg'$  daily at bedtime.  Dispense: 60 tablet; Refill: 3  Preterm labor symptoms and general obstetric precautions including but not limited to vaginal bleeding, contractions, leaking of fluid and fetal movement were reviewed in detail with the patient. Please refer to After Visit Summary for other counseling recommendations.   Return in about 2 weeks (around 08/10/2022) for IN-PERSON, LOB.  Future Appointments  Date Time Provider DeSahuarita8/22/2023  1:45 PM WMPort Jefferson Surgery CenterURSE WMTom Redgate Memorial Recovery CenterMCoral [facility]8/22/2023  2:00 PM WMC-MFC US1 WMC-MFCUS WMRiverside Doctors' Hospital [facility]8/22/2023  3:15 PM WMC-MFC NST WMC-MFC WMOzarks Community Hospital Of Gravette8/28/2023  2:15 PM WMC-MFC NURSE WMC-MFC WMSaint Marys Hospital8/28/2023  2:30 PM WMC-MFC US3 WMC-MFCUS WMAspen Hills Healthcare Center8/28/2023  4:00 PM WMC-MFC NST WMC-MFC WMNoland Hospital [facility]8/31/2023  4:15 PM KoStarr LakeCNM WMJackson County Public HospitalMEagle [facility] Surgery [facility]9/06/2022  4:15 PM KoStarr LakeCNM WMHalifax Psychiatric Center-NorthMWellspan [facility]9/13/2023  2:35 PM WaGabriel CarinaCNM WMValley Ambulatory Surgical CenterMThe [facility]9/20/2023  3:15 PM WaGabriel CarinaCNM WMChildrens Specialized HospitalMMountain [facility]9/27/2023  1:35 PM WaGabriel CarinaCNM WMEndoscopy Center Of Washington Dc LPMShannon [facility]10/03/2022  3:15 PM WaGabriel CarinaCNM WMSatanta District HospitalMOhsu Hospital [facility]10/10/2022  1:35 PM WaGabriel CarinaCNM WMMay[facility] IMNew Iberia [facility]10/18/2023  1:15 PM WMC-WOCA NST WMCovenant Medical CenterMSt. Joseph Hospital - Eureka10/18/2023  2:15 PM WaGabriel CarinaCNM WMC-CWH WMValley Forge Medical Center [facility] Hospital  JaGabriel CarinaCNM

## 2022-07-27 NOTE — Patient Instructions (Signed)
Rosanna Randy w/ [address], [place] [address] [phone] [email or web address]

## 2022-08-02 ENCOUNTER — Ambulatory Visit: Payer: Medicaid Other | Attending: Obstetrics and Gynecology

## 2022-08-02 ENCOUNTER — Ambulatory Visit: Payer: Medicaid Other | Admitting: *Deleted

## 2022-08-02 ENCOUNTER — Ambulatory Visit: Payer: Medicaid Other

## 2022-08-02 ENCOUNTER — Other Ambulatory Visit: Payer: Self-pay | Admitting: *Deleted

## 2022-08-02 VITALS — BP 122/64 | HR 82

## 2022-08-02 DIAGNOSIS — Z3480 Encounter for supervision of other normal pregnancy, unspecified trimester: Secondary | ICD-10-CM | POA: Insufficient documentation

## 2022-08-02 DIAGNOSIS — O365931 Maternal care for other known or suspected poor fetal growth, third trimester, fetus 1: Secondary | ICD-10-CM

## 2022-08-02 DIAGNOSIS — O365930 Maternal care for other known or suspected poor fetal growth, third trimester, not applicable or unspecified: Secondary | ICD-10-CM | POA: Diagnosis present

## 2022-08-02 DIAGNOSIS — Z3A32 32 weeks gestation of pregnancy: Secondary | ICD-10-CM | POA: Insufficient documentation

## 2022-08-02 DIAGNOSIS — O365990 Maternal care for other known or suspected poor fetal growth, unspecified trimester, not applicable or unspecified: Secondary | ICD-10-CM

## 2022-08-03 ENCOUNTER — Other Ambulatory Visit: Payer: Self-pay | Admitting: Obstetrics and Gynecology

## 2022-08-03 DIAGNOSIS — O365990 Maternal care for other known or suspected poor fetal growth, unspecified trimester, not applicable or unspecified: Secondary | ICD-10-CM

## 2022-08-08 ENCOUNTER — Ambulatory Visit: Payer: Medicaid Other | Attending: Obstetrics and Gynecology

## 2022-08-08 ENCOUNTER — Other Ambulatory Visit: Payer: Self-pay | Admitting: Obstetrics and Gynecology

## 2022-08-08 ENCOUNTER — Encounter: Payer: Self-pay | Admitting: *Deleted

## 2022-08-08 ENCOUNTER — Ambulatory Visit: Payer: Medicaid Other | Admitting: *Deleted

## 2022-08-08 ENCOUNTER — Ambulatory Visit: Payer: Medicaid Other

## 2022-08-08 VITALS — BP 119/64 | HR 84

## 2022-08-08 DIAGNOSIS — O365990 Maternal care for other known or suspected poor fetal growth, unspecified trimester, not applicable or unspecified: Secondary | ICD-10-CM | POA: Insufficient documentation

## 2022-08-08 DIAGNOSIS — O365930 Maternal care for other known or suspected poor fetal growth, third trimester, not applicable or unspecified: Secondary | ICD-10-CM | POA: Diagnosis not present

## 2022-08-08 DIAGNOSIS — Z3A33 33 weeks gestation of pregnancy: Secondary | ICD-10-CM | POA: Diagnosis not present

## 2022-08-08 DIAGNOSIS — Z362 Encounter for other antenatal screening follow-up: Secondary | ICD-10-CM | POA: Diagnosis not present

## 2022-08-08 DIAGNOSIS — Z3480 Encounter for supervision of other normal pregnancy, unspecified trimester: Secondary | ICD-10-CM | POA: Insufficient documentation

## 2022-08-08 DIAGNOSIS — O321XX0 Maternal care for breech presentation, not applicable or unspecified: Secondary | ICD-10-CM | POA: Insufficient documentation

## 2022-08-08 DIAGNOSIS — O365931 Maternal care for other known or suspected poor fetal growth, third trimester, fetus 1: Secondary | ICD-10-CM | POA: Insufficient documentation

## 2022-08-10 NOTE — Progress Notes (Unsigned)
   PRENATAL VISIT NOTE  Subjective:  Paige Michael is a 31 y.o. [phone] at 55w0dbeing seen today for ongoing prenatal care.  She is currently monitored for the following issues for this {Blank single:19197::"high-risk","low-risk"} pregnancy and has Moderate recurrent major depression (HColona; Supervision of other normal pregnancy, antepartum; Vasovagal response; and Fetal growth restriction antepartum on their problem list.  Patient reports {sx:14538}.   .  .   . Denies leaking of fluid.   The following portions of the patient's history were reviewed and updated as appropriate: allergies, current medications, past family history, past medical history, past social history, past surgical history and problem list.   Objective:  There were no vitals filed for this visit.  Fetal Status:           General:  Alert, oriented and cooperative. Patient is in no acute distress.  Skin: Skin is warm and dry. No rash noted.   Cardiovascular: Normal heart rate noted  Respiratory: Normal respiratory effort, no problems with respiration noted  Abdomen: Soft, gravid, appropriate for gestational age.        Pelvic: {Blank single:19197::"Cervical exam performed in the presence of a chaperone","Cervical exam deferred"}        Extremities: Normal range of motion.     Mental Status: Normal mood and affect. Normal behavior. Normal judgment and thought content.   Assessment and Plan:  Pregnancy: G4P3003 at 3101w0d. Supervision of other normal pregnancy, antepartum ***  {Blank single:19197::"Term","Preterm"} labor symptoms and general obstetric precautions including but not limited to vaginal bleeding, contractions, leaking of fluid and fetal movement were reviewed in detail with the patient. Please refer to After Visit Summary for other counseling recommendations.   No follow-ups on file.  Future Appointments  Date Time Provider DeBuna8/31/2023  4:15 PM KoBrown HumanMSurgicare GwinnettM[facility]9/04/2022  2:45 PM WMC-MFC NURSE WMC-MFC [facility]9/04/2022  3:00 PM WMC-MFC US7 WMC-MFCUS WMThedacare [facility]9/06/2022  4:15 PM KoStarr LakeCNM WMSurgical [facility]CM[facility]9/13/2023 12:30 PM WMC-MFC NURSE WMC-MFC WMPinnacle Pointe Behavioral [facility]9/13/2023 12:45 PM WMC-MFC US6 WMC-MFCUS WMMed[facility]9/13/2023  2:35 PM WaGabriel CarinaCNM WMWayne HospitalMLone [facility]9/20/2023  3:15 PM WaGabriel CarinaCNM WMNorthfield City Hospital [facility] NsgMBay [facility]9/27/2023  1:35 PM WaGabriel CarinaCNM WMOlympia Multi Specialty Clinic [facility]ulatory Procedures Cn[facility] PLLCMGibson [facility]10/03/2022  3:15 PM WaGabriel CarinaCNM WMMid [facility] LLCMSt. [facility]10/10/2022  1:35 PM WaGabriel CarinaCNM WMChoctaw Memorial HospitalMColonial [facility]10/18/2023  1:15 PM WMC-WOCA NST WMAdventhealth OrlandoMSchuylkill Medical Center [facility] [address] [facility]10/18/2023  2:15 PM WaGabriel CarinaCNM WMOtay Lakes Surgery Center LLCML[facility]  KaStarr LakeCNM

## 2022-08-11 ENCOUNTER — Ambulatory Visit (INDEPENDENT_AMBULATORY_CARE_PROVIDER_SITE_OTHER): Payer: Medicaid Other | Admitting: Student

## 2022-08-11 ENCOUNTER — Other Ambulatory Visit: Payer: Self-pay

## 2022-08-11 VITALS — BP 99/65 | HR 95 | Wt 196.0 lb

## 2022-08-11 DIAGNOSIS — Z3A34 34 weeks gestation of pregnancy: Secondary | ICD-10-CM

## 2022-08-11 DIAGNOSIS — Z3480 Encounter for supervision of other normal pregnancy, unspecified trimester: Secondary | ICD-10-CM

## 2022-08-16 ENCOUNTER — Ambulatory Visit: Payer: Medicaid Other | Attending: Maternal & Fetal Medicine

## 2022-08-16 ENCOUNTER — Ambulatory Visit: Payer: Medicaid Other

## 2022-08-16 NOTE — Progress Notes (Deleted)
   PRENATAL VISIT NOTE  Subjective:  Paige Michael is a 31 y.o. [phone] at 61w6dbeing seen today for ongoing prenatal care.  She is currently monitored for the following issues for this {Blank single:19197::"high-risk","low-risk"} pregnancy and has Moderate recurrent major depression (HDeercroft; Supervision of other normal pregnancy, antepartum; Vasovagal response; and Fetal growth restriction antepartum on their problem list.  Patient reports {sx:14538}.   .  .   . Denies leaking of fluid.   The following portions of the patient's history were reviewed and updated as appropriate: allergies, current medications, past family history, past medical history, past social history, past surgical history and problem list.   Objective:  There were no vitals filed for this visit.  Fetal Status:           General:  Alert, oriented and cooperative. Patient is in no acute distress.  Skin: Skin is warm and dry. No rash noted.   Cardiovascular: Normal heart rate noted  Respiratory: Normal respiratory effort, no problems with respiration noted  Abdomen: Soft, gravid, appropriate for gestational age.        Pelvic: {Blank single:19197::"Cervical exam performed in the presence of a chaperone","Cervical exam deferred"}        Extremities: Normal range of motion.     Mental Status: Normal mood and affect. Normal behavior. Normal judgment and thought content.   Assessment and Plan:  Pregnancy: G4P3003 at 358w6dhere are no diagnoses linked to this encounter. {Blank single:19197::"Term","Preterm"} labor symptoms and general obstetric precautions including but not limited to vaginal bleeding, contractions, leaking of fluid and fetal movement were reviewed in detail with the patient. Please refer to After Visit Summary for other counseling recommendations.   No follow-ups on file.  Future Appointments  Date Time Provider DePalm Desert9/04/2022  2:45 PM WMC-MFC NURSE WMC-MFC WMVib[facility]9/04/2022   3:00 PM WMC-MFC US7 WMC-MFCUS WMMemorialcare [facility]9/06/2022  4:15 PM KoStarr LakeCNM WMFitzgibbon HospitalMUpper [facility] Surgery [facility] LLC9/13/2023 12:30 PM WMC-MFC NURSE WMC-MFC WMSt Marys Ambulatory [facility]9/13/2023 12:45 PM WMC-MFC US6 WMC-MFCUS WMMount[facility]9/13/2023  2:35 PM WaGabriel CarinaCNM WMGeorge Regional HospitalMMinid[facility]9/20/2023  3:15 PM WaGabriel CarinaCNM WMChildren'S National Medical CenterMBlue Water [facility]9/27/2023  1:35 PM WaGabriel CarinaCNM WMWilmington GastroenterologyMM[facility]10/03/2022  3:15 PM WaGabriel CarinaCNM WMKindred Hospital - SycamoreMLifecare Hospit[facility]10/10/2022  1:35 PM WaGabriel CarinaCNM WMEye Surgery CenterMIowa [facility]ty [facility]10/18/2023  1:15 PM WMC-WOCA NST WMHarrison Surgery Center LLCMChe[facility]10/18/2023  2:15 PM WaGabriel CarinaCNM WMHouston Methodist Contin[facility] Care HospitalMM[facility]  KaStarr LakeCNM

## 2022-08-18 ENCOUNTER — Encounter: Payer: No Typology Code available for payment source | Admitting: Student

## 2022-08-23 ENCOUNTER — Telehealth: Payer: Self-pay | Admitting: Licensed Clinical Social Worker

## 2022-08-23 NOTE — Telephone Encounter (Signed)
Called pt regarding IBH referral. Unable to leave voicemail due vm not set up

## 2022-08-24 ENCOUNTER — Other Ambulatory Visit (HOSPITAL_COMMUNITY)
Admission: RE | Admit: 2022-08-24 | Discharge: 2022-08-24 | Disposition: A | Discharge status: Home / Self Care | Payer: Medicaid Other | Source: Ambulatory Visit | Attending: Certified Nurse Midwife | Admitting: Certified Nurse Midwife

## 2022-08-24 ENCOUNTER — Ambulatory Visit: Payer: Medicaid Other | Admitting: *Deleted

## 2022-08-24 ENCOUNTER — Ambulatory Visit: Payer: Medicaid Other | Attending: Maternal & Fetal Medicine

## 2022-08-24 ENCOUNTER — Ambulatory Visit (INDEPENDENT_AMBULATORY_CARE_PROVIDER_SITE_OTHER): Payer: No Typology Code available for payment source | Admitting: Certified Nurse Midwife

## 2022-08-24 VITALS — BP 124/71 | HR 67 | Wt 202.6 lb

## 2022-08-24 DIAGNOSIS — Z3A36 36 weeks gestation of pregnancy: Secondary | ICD-10-CM

## 2022-08-24 DIAGNOSIS — Z3A35 35 weeks gestation of pregnancy: Secondary | ICD-10-CM | POA: Insufficient documentation

## 2022-08-24 DIAGNOSIS — O0993 Supervision of high risk pregnancy, unspecified, third trimester: Secondary | ICD-10-CM

## 2022-08-24 DIAGNOSIS — O365931 Maternal care for other known or suspected poor fetal growth, third trimester, fetus 1: Secondary | ICD-10-CM | POA: Insufficient documentation

## 2022-08-24 DIAGNOSIS — O365930 Maternal care for other known or suspected poor fetal growth, third trimester, not applicable or unspecified: Secondary | ICD-10-CM

## 2022-08-24 DIAGNOSIS — O365990 Maternal care for other known or suspected poor fetal growth, unspecified trimester, not applicable or unspecified: Secondary | ICD-10-CM

## 2022-08-24 DIAGNOSIS — Z3493 Encounter for supervision of normal pregnancy, unspecified, third trimester: Secondary | ICD-10-CM | POA: Diagnosis not present

## 2022-08-25 LAB — GC/CHLAMYDIA PROBE AMP (~~LOC~~) NOT AT ARMC
Chlamydia: NEGATIVE
Comment: NEGATIVE
Comment: NORMAL
Neisseria Gonorrhea: NEGATIVE

## 2022-08-26 NOTE — Progress Notes (Signed)
PRENATAL VISIT NOTE  Subjective:  Paige Michael is a 31 y.o. [phone] at 3w2dbeing seen today for ongoing prenatal care.  She is currently monitored for the following issues for this low-risk pregnancy and has Moderate recurrent major depression (HHeath; Supervision of other normal pregnancy, antepartum; Vasovagal response; and Fetal growth restriction antepartum on their problem list.  Patient reports no complaints.  Contractions: Irritability. Vag. Bleeding: None.  Movement: Present. Denies leaking of fluid.   The following portions of the patient's history were reviewed and updated as appropriate: allergies, current medications, past family history, past medical history, past social history, past surgical history and problem list.   Objective:   Vitals:   08/24/22 1328  BP: 124/71  Pulse: 67  Weight: 202 lb 9.6 oz (91.9 kg)    Fetal Status: Fetal Heart Rate (bpm): 132 Fundal Height: 36 cm Movement: Present  Presentation: Vertex  General:  Alert, oriented and cooperative. Patient is in no acute distress.  Skin: Skin is warm and dry. No rash noted.   Cardiovascular: Normal heart rate noted  Respiratory: Normal respiratory effort, no problems with respiration noted  Abdomen: Soft, gravid, appropriate for gestational age.  Pain/Pressure: Present     Pelvic: Cervical exam deferred        Extremities: Normal range of motion.  Edema: None  Mental Status: Normal mood and affect. Normal behavior. Normal judgment and thought content.   Assessment and Plan:  Pregnancy: G4P3003 at 368w2d. Supervision of low risk pregnancy in third trimester - Doing well, feeling regular and vigorous fetal movement  - Pt interested in waterbirth and attended the class within the past two years - Reviewed conditions in labor that will risk her out of water immersion including thick meconium or blood stained amniotic fluid, non-reassuring fetal status on monitor, excessive bleeding,  hypertension, dizziness, use of IV meds, damaged equipment or staffing that does not allow for water immersion, etc.  - The attending midwife must be on the unit for water immersion to begin; pt understands this may delay the start of water immersion. - Reminded pt that signing consent in labor at the hospital also acknowledges they will exit the tub if the attending midwife requests. - Consent given to patient for review.  Consent will be reviewed and signed at the hospital by the waterbirth provider prior to use of the tub. - Discussed other labor support options if waterbirth becomes unavailable, including position change, freedom of movement, use of birthing ball, and/or use of hydrotherapy in the shower (dependent upon medical condition/provider discretion).   2. [redacted] weeks gestation of pregnancy - Routine OB care  - GC/Chlamydia probe amp ([place])not at [facility] Culture, beta strep (group b only)  3. Fetal growth restriction antepartum - Completely resolved, as of today, baby in the 41%ile - Can proceed with water labor/birth if desired.  Preterm labor symptoms and general obstetric precautions including but not limited to vaginal bleeding, contractions, leaking of fluid and fetal movement were reviewed in detail with the patient. Please refer to After Visit Summary for other counseling recommendations.   Return in about 1 week (around 08/31/2022) for IN-PERSON, LOB.  Future Appointments  Date Time Provider DeDallas9/20/2023  3:15 PM WaHelaine ChessMHoulton Regional HospitalMPro[facility]9/27/2023  1:35 PM WaHelaine ChessMCovington County HospitalMS[facility]10/03/2022  3:15 PM WaHelaine ChessMLifecare Hospitals Of North CarolinaMEye [facility]10/10/2022  1:35 PM WaHelaine ChessMThe Specialty Hospital [facility] MeridianMCurry [facility]10/18/2023  1:15  PM [facility] N[facility]  09/28/2022  2:15 PM Gabriel Carina, CNM [facility]    Gabriel Carina, CNM

## 2022-08-28 LAB — CULTURE, BETA STREP (GROUP B ONLY): Strep Gp B Culture: NEGATIVE

## 2022-08-31 ENCOUNTER — Ambulatory Visit (INDEPENDENT_AMBULATORY_CARE_PROVIDER_SITE_OTHER): Payer: Medicaid Other | Admitting: Certified Nurse Midwife

## 2022-08-31 ENCOUNTER — Other Ambulatory Visit: Payer: Self-pay

## 2022-08-31 VITALS — BP 134/67 | HR 82 | Wt 207.1 lb

## 2022-08-31 DIAGNOSIS — Z3A37 37 weeks gestation of pregnancy: Secondary | ICD-10-CM

## 2022-08-31 DIAGNOSIS — O365990 Maternal care for other known or suspected poor fetal growth, unspecified trimester, not applicable or unspecified: Secondary | ICD-10-CM

## 2022-08-31 DIAGNOSIS — Z3493 Encounter for supervision of normal pregnancy, unspecified, third trimester: Secondary | ICD-10-CM

## 2022-08-31 DIAGNOSIS — Z3A36 36 weeks gestation of pregnancy: Secondary | ICD-10-CM

## 2022-08-31 NOTE — Patient Instructions (Signed)

## 2022-08-31 NOTE — Progress Notes (Signed)
   PRENATAL VISIT NOTE  Subjective:  Paige Michael is a 31 y.o. [phone] at 2w0dbeing seen today for ongoing prenatal care.  She is currently monitored for the following issues for this low-risk pregnancy and has Moderate recurrent major depression (HMcHenry; Supervision of other normal pregnancy, antepartum; Vasovagal response; and Fetal growth restriction antepartum on their problem list.  Patient reports no complaints.  Contractions: Irritability. Vag. Bleeding: None.  Movement: Present. Denies leaking of fluid.   The following portions of the patient's history were reviewed and updated as appropriate: allergies, current medications, past family history, past medical history, past social history, past surgical history and problem list.   Objective:   Vitals:   08/31/22 1516  BP: 134/67  Pulse: 82  Weight: 207 lb 1.6 oz (93.9 kg)    Fetal Status: Fetal Heart Rate (bpm): 130 Fundal Height: 38 cm Movement: Present  Presentation: Vertex  General:  Alert, oriented and cooperative. Patient is in no acute distress.  Skin: Skin is warm and dry. No rash noted.   Cardiovascular: Normal heart rate noted  Respiratory: Normal respiratory effort, no problems with respiration noted  Abdomen: Soft, gravid, appropriate for gestational age.  Pain/Pressure: Present     Pelvic: Cervical exam deferred        Extremities: Normal range of motion.  Edema: None  Mental Status: Normal mood and affect. Normal behavior. Normal judgment and thought content.   Assessment and Plan:  Pregnancy: G4P3003 at 328w0d. Encounter for supervision of low-risk pregnancy in third trimester - Doing well, feeling regular and vigorous fetal movement   2. [redacted] weeks gestation of pregnancy - Routine OB care   3. Fetal growth restriction antepartum - Resolved per last U/S  Term labor symptoms and general obstetric precautions including but not limited to vaginal bleeding, contractions, leaking of fluid and fetal  movement were reviewed in detail with the patient. Please refer to After Visit Summary for other counseling recommendations.   Return in about 1 week (around 09/07/2022) for IN-PERSON, LOB.  Future Appointments  Date Time Provider DeW[place]9/27/2023  1:35 PM WaHelaine ChessMBaldwin Area Med CtrMThree [facility] Inc10/03/2022  3:15 PM WaHelaine ChessMResurgens East Surgery Center LLCMSt [facility]10/10/2022  1:35 PM WaHelaine ChessMNyu Lutheran Medical CenterMHegg [facility]10/18/2023  1:15 PM WMC-WOCA NST WMSan Marcos Asc LLCMAdventist Medical Center [facility]10/18/2023  2:15 PM WaGabriel CarinaCNM WMC-CWH WMSpecialty Surgical [facility]  JaGabriel CarinaCNM

## 2022-09-05 ENCOUNTER — Encounter: Payer: Self-pay | Admitting: *Deleted

## 2022-09-07 ENCOUNTER — Other Ambulatory Visit: Payer: Self-pay

## 2022-09-07 ENCOUNTER — Ambulatory Visit (INDEPENDENT_AMBULATORY_CARE_PROVIDER_SITE_OTHER): Payer: Medicaid Other | Admitting: Certified Nurse Midwife

## 2022-09-07 VITALS — BP 123/71 | HR 82 | Wt 206.6 lb

## 2022-09-07 DIAGNOSIS — Z3A38 38 weeks gestation of pregnancy: Secondary | ICD-10-CM

## 2022-09-07 DIAGNOSIS — O365990 Maternal care for other known or suspected poor fetal growth, unspecified trimester, not applicable or unspecified: Secondary | ICD-10-CM

## 2022-09-07 DIAGNOSIS — Z3493 Encounter for supervision of normal pregnancy, unspecified, third trimester: Secondary | ICD-10-CM

## 2022-09-07 NOTE — Progress Notes (Signed)
   PRENATAL VISIT NOTE  Subjective:  Paige Michael is a 31 y.o. [phone] at 82w0dbeing seen today for ongoing prenatal care.  She is currently monitored for the following issues for this low-risk pregnancy and has Moderate recurrent major depression (HUnion; Supervision of other normal pregnancy, antepartum; Vasovagal response; and Fetal growth restriction antepartum on their problem list.  Patient reports no complaints.  Contractions: Irritability. Vag. Bleeding: None.  Movement: Present. Denies leaking of fluid.   The following portions of the patient's history were reviewed and updated as appropriate: allergies, current medications, past family history, past medical history, past social history, past surgical history and problem list.   Objective:   Vitals:   09/07/22 1318  BP: 123/71  Pulse: 82  Weight: 206 lb 9.6 oz (93.7 kg)    Fetal Status: Fetal Heart Rate (bpm): 128 Fundal Height: 39 cm Movement: Present  Presentation: Vertex  General:  Alert, oriented and cooperative. Patient is in no acute distress.  Skin: Skin is warm and dry. No rash noted.   Cardiovascular: Normal heart rate noted  Respiratory: Normal respiratory effort, no problems with respiration noted  Abdomen: Soft, gravid, appropriate for gestational age.  Pain/Pressure: Present     Pelvic: Cervical exam deferred        Extremities: Normal range of motion.  Edema: None  Mental Status: Normal mood and affect. Normal behavior. Normal judgment and thought content.   Assessment and Plan:  Pregnancy: G4P3003 at 340w0d. Encounter for supervision of low-risk pregnancy in third trimester .jw  2. [redacted] weeks gestation of pregnancy ***  3. Fetal growth restriction antepartum ***  {Blank single:19197::"Term","Preterm"} labor symptoms and general obstetric precautions including but not limited to vaginal bleeding, contractions, leaking of fluid and fetal movement were reviewed in detail with the  patient. Please refer to After Visit Summary for other counseling recommendations.   Return in about 1 week (around 09/14/2022) for IN-PERSON, LOB.  Future Appointments  Date Time Provider DeRosendale Hamlet10/03/2022  3:15 PM WaHelaine ChessMThe [facility] LLCMSur[facility]10/10/2022  1:35 PM WaHelaine ChessMWhite Plains Hospital CenterMCatholic Medical Center10/18/2023  1:15 PM WMC-WOCA NST WMTristar Horizon Medical CenterMEnc[facility]10/18/2023  2:15 PM WaGabriel CarinaCNM WMSt. Luke'S [facility] Woodlands HospitalMW[facility]  JaGabriel CarinaCNM

## 2022-09-14 ENCOUNTER — Ambulatory Visit (INDEPENDENT_AMBULATORY_CARE_PROVIDER_SITE_OTHER): Payer: Medicaid Other | Admitting: Certified Nurse Midwife

## 2022-09-14 ENCOUNTER — Other Ambulatory Visit: Payer: Self-pay

## 2022-09-14 VITALS — BP 124/67 | HR 72 | Wt 211.7 lb

## 2022-09-14 DIAGNOSIS — Z3A39 39 weeks gestation of pregnancy: Secondary | ICD-10-CM

## 2022-09-14 DIAGNOSIS — Z3493 Encounter for supervision of normal pregnancy, unspecified, third trimester: Secondary | ICD-10-CM

## 2022-09-14 NOTE — Progress Notes (Signed)
   PRENATAL VISIT NOTE  Subjective:  Paige Michael is a 31 y.o. [phone] at 4w0dbeing seen today for ongoing prenatal care.  She is currently monitored for the following issues for this low-risk pregnancy and has Moderate recurrent major depression (HColfax; Supervision of other normal pregnancy, antepartum; Vasovagal response; and Fetal growth restriction antepartum on their problem list.  Patient reports no complaints.  Contractions: Irritability. Vag. Bleeding: None.  Movement: Present. Denies leaking of fluid.   The following portions of the patient's history were reviewed and updated as appropriate: allergies, current medications, past family history, past medical history, past social history, past surgical history and problem list.   Objective:   Vitals:   09/14/22 1523  BP: 124/67  Pulse: 72  Weight: 211 lb 11.2 oz (96 kg)    Fetal Status: Fetal Heart Rate (bpm): 130 Fundal Height: 39 cm Movement: Present  Presentation: Vertex  General:  Alert, oriented and cooperative. Patient is in no acute distress.  Skin: Skin is warm and dry. No rash noted.   Cardiovascular: Normal heart rate noted  Respiratory: Normal respiratory effort, no problems with respiration noted  Abdomen: Soft, gravid, appropriate for gestational age.  Pain/Pressure: Present     Pelvic: Cervical exam deferred        Extremities: Normal range of motion.  Edema: None  Mental Status: Normal mood and affect. Normal behavior. Normal judgment and thought content.   Assessment and Plan:  Pregnancy: G4P3003 at 374w0d. Encounter for supervision of low-risk pregnancy in third trimester - Doing well, feeling regular and vigorous fetal movement   2. [redacted] weeks gestation of pregnancy - Routine OB care   Term labor symptoms and general obstetric precautions including but not limited to vaginal bleeding, contractions, leaking of fluid and fetal movement were reviewed in detail with the patient. Please refer to  After Visit Summary for other counseling recommendations.   Return in about 1 week (around 09/21/2022) for IN-PERSON, LOB.  Future Appointments  Date Time Provider DeWilliamsburg10/10/2022  1:35 PM WaHelaine ChessMKessler Institute For Rehabilitation - West OrangeMDecatur [facility]10/18/2023  1:15 PM WMC-WOCA NST WMStillwater Medical PerryMBapt[facility]10/18/2023  2:15 PM WaGabriel CarinaCNM WMGrady General HospitalMLee [facility]  JaGabriel CarinaCNM

## 2022-09-21 ENCOUNTER — Other Ambulatory Visit: Payer: Self-pay

## 2022-09-21 ENCOUNTER — Ambulatory Visit (INDEPENDENT_AMBULATORY_CARE_PROVIDER_SITE_OTHER): Payer: Medicaid Other | Admitting: Certified Nurse Midwife

## 2022-09-21 ENCOUNTER — Inpatient Hospital Stay (HOSPITAL_COMMUNITY): Admit: 2022-09-21 | Payer: Self-pay

## 2022-09-21 VITALS — BP 125/73 | HR 78 | Wt 221.2 lb

## 2022-09-21 DIAGNOSIS — Z3493 Encounter for supervision of normal pregnancy, unspecified, third trimester: Secondary | ICD-10-CM | POA: Diagnosis not present

## 2022-09-21 DIAGNOSIS — Z3A40 40 weeks gestation of pregnancy: Secondary | ICD-10-CM

## 2022-09-22 NOTE — Progress Notes (Signed)
   PRENATAL VISIT NOTE  Subjective:  Paige Michael is a 31 y.o. [phone] at 58w1dbeing seen today for ongoing prenatal care.  She is currently monitored for the following issues for this low-risk pregnancy and has Moderate recurrent major depression (HEphesus; Supervision of other normal pregnancy, antepartum; Vasovagal response; and Fetal growth restriction antepartum on their problem list.  Patient reports no complaints.  Contractions: Irritability. Vag. Bleeding: None.  Movement: Present. Denies leaking of fluid.   The following portions of the patient's history were reviewed and updated as appropriate: allergies, current medications, past family history, past medical history, past social history, past surgical history and problem list.   Objective:   Vitals:   09/21/22 1331  BP: 125/73  Pulse: 78  Weight: 221 lb 3.2 oz (100.3 kg)    Fetal Status: Fetal Heart Rate (bpm): 132 Fundal Height: 40 cm Movement: Present  Presentation: Vertex  General:  Alert, oriented and cooperative. Patient is in no acute distress.  Skin: Skin is warm and dry. No rash noted.   Cardiovascular: Normal heart rate noted  Respiratory: Normal respiratory effort, no problems with respiration noted  Abdomen: Soft, gravid, appropriate for gestational age.  Pain/Pressure: Present     Pelvic: Cervical exam deferred        Extremities: Normal range of motion.  Edema: None  Mental Status: Normal mood and affect. Normal behavior. Normal judgment and thought content.   Assessment and Plan:  Pregnancy: G4P3003 at 470w1d. Encounter for supervision of low-risk pregnancy in third trimester - Doing well, feeling regular and vigorous fetal movement   2. [redacted] weeks gestation of pregnancy - Routine OB care - discussed IOL timing, pt prefers to wait until 42 weeks if possible and declines all needle sticks and routine Pitocin. Will accept IV and other meds if needed. Has no history of postpartum hemorrhage but does  have a history of a fear of needles with syncopal episodes. - Fetal nonstress test normal today. Will schedule NST at next visit.  Term labor symptoms and general obstetric precautions including but not limited to vaginal bleeding, contractions, leaking of fluid and fetal movement were reviewed in detail with the patient. Please refer to After Visit Summary for other counseling recommendations.   Return in about 1 week (around 09/28/2022) for HOWekiva SpringsNST/BPP as scheduled.  Future Appointments  Date Time Provider DeTurkey Creek10/18/2023  1:15 PM WMCarolina Ambulatory Surgery CenterST WMThe Medical Center At CavernaMSsm St Clare Surgical [facility] LLC10/18/2023  2:15 PM WaGabriel CarinaCNM WMMerit Health River OaksMLewisburg Plastic Surgery [facility] Center  JaGabriel CarinaCNM

## 2022-09-24 ENCOUNTER — Encounter (HOSPITAL_COMMUNITY): Payer: Self-pay | Admitting: Obstetrics & Gynecology

## 2022-09-24 ENCOUNTER — Inpatient Hospital Stay (HOSPITAL_COMMUNITY)
Admission: AD | Admit: 2022-09-24 | Discharge: 2022-09-25 | DRG: 807 | Disposition: A | Discharge status: Home / Self Care | Payer: Medicaid Other | Attending: Obstetrics & Gynecology | Admitting: Obstetrics & Gynecology

## 2022-09-24 ENCOUNTER — Other Ambulatory Visit: Payer: Self-pay

## 2022-09-24 DIAGNOSIS — Z3A40 40 weeks gestation of pregnancy: Secondary | ICD-10-CM

## 2022-09-24 DIAGNOSIS — O4202 Full-term premature rupture of membranes, onset of labor within 24 hours of rupture: Secondary | ICD-10-CM | POA: Diagnosis not present

## 2022-09-24 DIAGNOSIS — O4292 Full-term premature rupture of membranes, unspecified as to length of time between rupture and onset of labor: Secondary | ICD-10-CM | POA: Diagnosis present

## 2022-09-24 DIAGNOSIS — Z3480 Encounter for supervision of other normal pregnancy, unspecified trimester: Principal | ICD-10-CM

## 2022-09-24 DIAGNOSIS — O365990 Maternal care for other known or suspected poor fetal growth, unspecified trimester, not applicable or unspecified: Secondary | ICD-10-CM | POA: Diagnosis not present

## 2022-09-24 DIAGNOSIS — O480 Post-term pregnancy: Secondary | ICD-10-CM | POA: Diagnosis present

## 2022-09-24 DIAGNOSIS — O4290 Premature rupture of membranes, unspecified as to length of time between rupture and onset of labor, unspecified weeks of gestation: Secondary | ICD-10-CM | POA: Diagnosis present

## 2022-09-24 LAB — CBC
HCT: 31.9 % — ABNORMAL LOW (ref 36.0–46.0)
Hemoglobin: 10.2 g/dL — ABNORMAL LOW (ref 12.0–15.0)
MCH: 26.9 pg (ref 26.0–34.0)
MCHC: 32 g/dL (ref 30.0–36.0)
MCV: 84.2 fL (ref 80.0–100.0)
Platelets: 255 10*3/uL (ref 150–400)
RBC: 3.79 MIL/uL — ABNORMAL LOW (ref 3.87–5.11)
RDW: 14.6 % (ref 11.5–15.5)
WBC: 12.7 10*3/uL — ABNORMAL HIGH (ref 4.0–10.5)
nRBC: 0 % (ref 0.0–0.2)

## 2022-09-24 LAB — POCT FERN TEST: POCT Fern Test: POSITIVE

## 2022-09-24 MED ORDER — SENNOSIDES-DOCUSATE SODIUM 8.6-50 MG PO TABS
2.0000 | ORAL_TABLET | Freq: Every day | ORAL | Status: DC
  Filled 2022-09-24: qty 2

## 2022-09-24 MED ORDER — OXYCODONE-ACETAMINOPHEN 5-325 MG PO TABS: 2.0000 | ORAL_TABLET | ORAL | Status: DC | PRN

## 2022-09-24 MED ORDER — ONDANSETRON HCL 4 MG/2ML IJ SOLN: 4.0000 mg | Freq: Four times a day (QID) | INTRAMUSCULAR | Status: DC | PRN

## 2022-09-24 MED ORDER — TERBUTALINE SULFATE 1 MG/ML IJ SOLN: 0.2500 mg | Freq: Once | INTRAMUSCULAR | Status: DC | PRN

## 2022-09-24 MED ORDER — OXYCODONE HCL 5 MG PO TABS: 5.0000 mg | ORAL_TABLET | ORAL | Status: DC | PRN

## 2022-09-24 MED ORDER — OXYTOCIN-SODIUM CHLORIDE 30-0.9 UT/500ML-% IV SOLN: 2.5000 [IU]/h | INTRAVENOUS | Status: DC

## 2022-09-24 MED ORDER — PRENATAL MULTIVITAMIN CH
1.0000 | ORAL_TABLET | Freq: Every day | ORAL | Status: DC
  Administered 2022-09-25: 1 via ORAL
  Filled 2022-09-24: qty 1

## 2022-09-24 MED ORDER — IBUPROFEN 600 MG PO TABS
600.0000 mg | ORAL_TABLET | Freq: Four times a day (QID) | ORAL | Status: DC
  Administered 2022-09-25 (×4): 600 mg via ORAL
  Filled 2022-09-24 (×4): qty 1

## 2022-09-24 MED ORDER — SIMETHICONE 80 MG PO CHEW: 80.0000 mg | CHEWABLE_TABLET | ORAL | Status: DC | PRN

## 2022-09-24 MED ORDER — WITCH HAZEL-GLYCERIN EX PADS: 1.0000 | MEDICATED_PAD | CUTANEOUS | Status: DC | PRN

## 2022-09-24 MED ORDER — LIDOCAINE HCL (PF) 1 % IJ SOLN
INTRAMUSCULAR | Status: AC
  Filled 2022-09-24: qty 30

## 2022-09-24 MED ORDER — OXYTOCIN BOLUS FROM INFUSION: 333.0000 mL | Freq: Once | INTRAVENOUS | Status: DC

## 2022-09-24 MED ORDER — LIDOCAINE HCL (PF) 1 % IJ SOLN: 30.0000 mL | INTRAMUSCULAR | Status: DC | PRN

## 2022-09-24 MED ORDER — ONDANSETRON HCL 4 MG PO TABS: 4.0000 mg | ORAL_TABLET | ORAL | Status: DC | PRN

## 2022-09-24 MED ORDER — FLEET ENEMA 7-19 GM/118ML RE ENEM: 1.0000 | ENEMA | RECTAL | Status: DC | PRN

## 2022-09-24 MED ORDER — ACETAMINOPHEN 325 MG PO TABS
650.0000 mg | ORAL_TABLET | ORAL | Status: DC | PRN
  Administered 2022-09-25 (×2): 650 mg via ORAL
  Filled 2022-09-24 (×2): qty 2

## 2022-09-24 MED ORDER — ONDANSETRON HCL 4 MG/2ML IJ SOLN: 4.0000 mg | INTRAMUSCULAR | Status: DC | PRN

## 2022-09-24 MED ORDER — DIBUCAINE (PERIANAL) 1 % EX OINT: 1.0000 | TOPICAL_OINTMENT | CUTANEOUS | Status: DC | PRN

## 2022-09-24 MED ORDER — SOD CITRATE-CITRIC ACID 500-334 MG/5ML PO SOLN: 30.0000 mL | ORAL | Status: DC | PRN

## 2022-09-24 MED ORDER — OXYCODONE-ACETAMINOPHEN 5-325 MG PO TABS: 1.0000 | ORAL_TABLET | ORAL | Status: DC | PRN

## 2022-09-24 MED ORDER — LACTATED RINGERS IV SOLN
500.0000 mL | INTRAVENOUS | Status: DC | PRN
  Administered 2022-09-24 (×2): 500 mL via INTRAVENOUS

## 2022-09-24 MED ORDER — COCONUT OIL OIL: 1.0000 | TOPICAL_OIL | Status: DC | PRN

## 2022-09-24 MED ORDER — MISOPROSTOL 50MCG HALF TABLET
50.0000 ug | ORAL_TABLET | ORAL | Status: DC | PRN
  Administered 2022-09-24: 50 ug via ORAL

## 2022-09-24 MED ORDER — BENZOCAINE-MENTHOL 20-0.5 % EX AERO: 1.0000 | INHALATION_SPRAY | CUTANEOUS | Status: DC | PRN

## 2022-09-24 MED ORDER — ACETAMINOPHEN 325 MG PO TABS: 650.0000 mg | ORAL_TABLET | ORAL | Status: DC | PRN

## 2022-09-24 MED ORDER — OXYCODONE HCL 5 MG PO TABS: 10.0000 mg | ORAL_TABLET | ORAL | Status: DC | PRN

## 2022-09-24 MED ORDER — DIPHENHYDRAMINE HCL 25 MG PO CAPS: 25.0000 mg | ORAL_CAPSULE | Freq: Four times a day (QID) | ORAL | Status: DC | PRN

## 2022-09-24 MED ORDER — LACTATED RINGERS IV SOLN: INTRAVENOUS | Status: DC

## 2022-09-24 NOTE — MAU Note (Signed)
Paige Michael is a 31 y.o. at 56w3dhere in MAU reporting: her water broke @ 0550 this morning.  States ctxs are 7-8 minutes apart.  Denies VB.  Endorses +FM. LMP: N/A Onset of complaint: 0550 Pain score: 0 Vitals:   09/24/22 1020  BP: 118/74  Pulse: 77  Resp: 18  Temp: 97.8 F (36.6 C)  SpO2: 96%     FHT:117 bpm Lab orders placed from triage:   NA

## 2022-09-24 NOTE — Progress Notes (Signed)
Patient ID: Paige Michael, female   DOB: [birth year], 31 y.o.   MRN: [id number]  .Labor Progress Note Paige Michael is a 31 y.o. [phone] at 76w3dpresented for PROM  S:  Called to bedside to assess pt, having much more regular and painful contractions. Wants to know if she can get in the water.  O:  BP 117/69   Pulse 98   Temp 98.8 F (37.1 C) (Oral)   Resp 18   Ht '5\' 4"'$  (1.626 m)   Wt 211 lb 14.4 oz (96.1 kg)   LMP  (LMP Unknown)   SpO2 96%   BMI 36.37 kg/m  EFM: baseline 115 bpm/ moderate variability/ 15x15 accels/ no decels  Toco/IUPC: q373m SVE: Dilation: 6 Effacement (%): 60 Station: 0 Presentation: Vertex Exam by:: J.Candie ChromanNM Pitocin: None  A/P: [age].o. G4P3003 4057w3d. Labor: Active, likely to transition soon 2. FWB: Cat 1 - now fully reactive after fluid bolus 3. Pain: Cleared for waterbirth, consent signed and pt in tub. - Reviewed conditions in labor that will risk her out of water immersion including thick meconium or blood stained amniotic fluid, non-reassuring fetal status on monitor, excessive bleeding, hypertension, dizziness, use of IV meds, damaged equipment or staffing that does not allow for water immersion, etc.  - Reminded pt that signing consent also acknowledges they will exit the tub if the attending midwife requests. - Consent given to patient for review.  Consent reviewed and signed by pt and CNM  Pt in water, credentialing CNM called and now in house. Anticipate SVD.  JamGaylan GeroldNM, MSN, IBCTenkillerrtified Nurse Midwife, ConChattaroyoup

## 2022-09-24 NOTE — Progress Notes (Signed)
Patient ID: Paige Michael, female   DOB: 06-05-1991, 31 y.o.   MRN: [id number]  .Labor Progress Note Paige Michael is a 31 y.o. [phone] at 68w3dpresented for PROM  S:  Pt reporting increased discomfort with contractions, but they are still 5-172m apart. Getting tired of so few contractions but does not want many interventions.  O:  BP 117/69   Pulse 98   Temp 98.8 F (37.1 C) (Oral)   Resp 18   Ht '5\' 4"'$  (1.626 m)   Wt 211 lb 14.4 oz (96.1 kg)   LMP  (LMP Unknown)   SpO2 96%   BMI 36.37 kg/m  EFM: baseline 115 bpm/ moderate variability/ 15x15 accels/ no decels  Toco/IUPC: irregular, appear strong but still very irregular SVE: Dilation: 4 Effacement (%): 60 Station: -3 Presentation: Vertex Exam by:: J.Candie ChromanNM Pitocin: None  A/P: [age].o. G4P3003 4046w3d. Labor: Latent, encouraged something to strengthen contractions, pt amenable to cytotec. 2. FWB: Cat 1 - still having periods of minimal reactivity, stressed to pt importance of a reactive strip for waterbirth and encouraged juice/water for hydration. 3. Pain: Planning waterbirth, using breathing techniques for contractions and coping well  64m28muccal cytotec given and pt encouraged to ambulate. Will consent for waterbirth in active labor. Anticipate SVD.  JamiGaylan GeroldM, MSN, IBCLNaval Academytified Nurse Midwife, [facility]

## 2022-09-24 NOTE — Discharge Summary (Signed)
Postpartum Discharge Summary  Date of Service updated***     Patient Name: Paige Michael DOB: [birth year] MRN: [id number]  Date of admission: 09/24/2022 Delivery date:09/24/2022  Delivering provider: Deloris Ping  Date of discharge: 09/24/2022  Admitting diagnosis: Amniotic fluid leaking [O42.90] Intrauterine pregnancy: [redacted]w[redacted]d    Secondary diagnosis:  Active Problems:   Amniotic fluid leaking  Additional problems: None    Discharge diagnosis: Term Pregnancy Delivered (waterbirth)                                     Postpartum procedures:{Postpartum procedures:23558} Augmentation: Cytotec Complications: None  Hospital course: Onset of Labor With Vaginal Delivery      31y.o. yo [id number] 435w3das admitted in Latent Labor on 09/24/2022. Labor course was uncomplicated. Membrane Rupture Time/Date: 5:50 AM ,09/24/2022   Delivery Method:Vaginal, Spontaneous  Episiotomy:  None Lacerations:   Clitoral Patient had a postpartum course complicated by ***.  She is ambulating, tolerating a regular diet, passing flatus, and urinating well. Patient is discharged home in stable condition on 09/24/22.  Newborn Data: Birth date:09/24/2022  Birth time:8:37 PM  Gender:Female  Living status:Living  Apgars:8 ,9  Weight:   Magnesium Sulfate received: No BMZ received: No Rhophylac:N/A MMR:No T-DaP: Declined (fear of needles) Flu: No Transfusion:No  Physical exam  Vitals:   09/24/22 1814 09/24/22 2045 09/24/22 2058 09/24/22 2116  BP: 117/69 (!) 141/76 121/87 (!) 130/94  Pulse: 98 77 82 81  Resp:      Temp:      TempSrc:      SpO2:      Weight:      Height:       General: {Exam; general:21111117} Lochia: {Desc; appropriate/inappropriate:30686::"appropriate"} Uterine Fundus: {Desc; firm/soft:30687} Incision: {Exam; incision:21111123} DVT Evaluation: {Exam; dvt:2111122} Labs: Lab Results  Component Value Date   WBC 12.7 (H) 09/24/2022   HGB 10.2 (L)  09/24/2022   HCT 31.9 (L) 09/24/2022   MCV 84.2 09/24/2022   PLT 255 09/24/2022      Latest Ref Rng & Units 02/10/2022    1:37 PM  CMP  Glucose 70 - 99 mg/dL 76   BUN 6 - 23 mg/dL 9   Creatinine 0.40 - 1.20 mg/dL 0.50   Sodium 135 - 145 mEq/L 138   Potassium 3.5 - 5.1 mEq/L 3.8   Chloride 96 - 112 mEq/L 105   CO2 19 - 32 mEq/L 23   Calcium 8.4 - 10.5 mg/dL 8.7   Total Protein 6.0 - 8.3 g/dL 6.7   Total Bilirubin 0.2 - 1.2 mg/dL 0.5   Alkaline Phos 39 - 117 U/L 79   AST 0 - 37 U/L 29   ALT 0 - 35 U/L 32    Edinburgh Score:    11/30/2020    5:29 PM  Edinburgh Postnatal Depression Scale Screening Tool  I have been able to laugh and see the funny side of things. 0  I have looked forward with enjoyment to things. 0  I have blamed myself unnecessarily when things went wrong. 0  I have been anxious or worried for no good reason. 1  I have felt scared or panicky for no good reason. 0  Things have been getting on top of me. 1  I have been so unhappy that I have had difficulty sleeping. 0  I have felt sad or miserable. 1  I have been so  unhappy that I have been crying. 0  The thought of harming myself has occurred to me. 0  Edinburgh Postnatal Depression Scale Total 3     After visit meds:  Allergies as of 09/24/2022   No Known Allergies   Med Rec must be completed prior to using this [facility]***        Discharge home in stable condition Infant Feeding: {Baby feeding:23562} Infant Disposition:{CHL IP OB HOME WITH LUDAPT:00525} Discharge instruction: per After Visit Summary and Postpartum booklet. Activity: Advance as tolerated. Pelvic rest for 6 weeks.  Diet: {OB LTGA:89022840} Future Appointments: Future Appointments  Date Time Provider [place]  11/02/2022 11:15 AM Gabriel Carina, CNM [facility]   Follow up Visit: Postpartum visit scheduled, no message sent.  Please schedule this patient for a In person postpartum visit in 6 weeks with the  following provider:  Gaylan Gerold, CNM . Additional Postpartum F/U: None   Low risk pregnancy complicated by:  None Delivery mode:  Vaginal, Spontaneous  Anticipated Birth Control:   NFP   09/24/2022 Gabriel Carina, CNM

## 2022-09-24 NOTE — H&P (Signed)
OBSTETRIC ADMISSION HISTORY AND PHYSICAL  Paige Michael is a 31 y.o. female [phone] with IUP at 12w3dby 12wk U/S presenting for SROM with minimal contractions. She reports +FMs, No LOF, no VB, no blurry vision, headaches or peripheral edema, and RUQ pain.  She plans on breast feeding. She request natural family planning for birth control. [facility] Estimated Date of Delivery: 09/21/22 Sono:  '@36wd'$ , CWD, normal anatomy, cephalic presentation, 27124P 41% EFW - of note, HC=92%ile  Prenatal History/Complications: Largely uncomplicated aside from now resolved FGR. Major fear of needles, has had syncopal episodes during lab draws.  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  WISDOM TOOTH EXTRACTION  2009   Obstetrical History: OB History     Gravida  4   Para  3   Term  3   Preterm  0   AB  0   Living  3      SAB  0   IAB  0   Ectopic  0   Multiple  0   Live Births  3          Social History Social History   Socioeconomic History   Marital status: Married    Spouse name: Not on file   Number of children: Not on file   Years of education: Not on file   Highest education level: Not on file  Occupational History   Not on file  Tobacco Use   Smoking status: Never   Smokeless tobacco: Never  Vaping Use   Vaping Use: Never used  Substance and Sexual Activity   Alcohol use: Never   Drug use: Never   Sexual activity: Yes    Birth control/protection: None  Other Topics Concern   Not on file  Social History Narrative   Not on file   Social Determinants of Health   Financial Resource Strain: Not on file  Food Insecurity: No Food Insecurity (08/31/2022)   Hunger Vital Sign    Worried About Running Out of Food in the Last Year: Never true    Ran Out of Food in the Last Year:  Never true  Transportation Needs: No Transportation Needs (08/31/2022)   PRAPARE - THydrologist(Medical): No    Lack of Transportation (Non-Medical): No  Physical Activity: Not on file  Stress: Not on file  Social Connections: Not on file   Family History: Family History  Adopted: Yes  Problem Relation Age of Onset   Drug abuse Mother    Alcohol abuse Mother    Drug abuse Father    Alcohol abuse Father    Allergies: No Known Allergies  Medications Prior to Admission  Medication Sig Dispense Refill Last Dose   acetaminophen (TYLENOL) 500 MG tablet Take 1,000 mg by mouth every 6 (six) hours as needed for mild pain.   Past Week   Prenatal Vit-Fe Fumarate-FA (PREPLUS) 27-1 MG TABS Take 1 tablet by mouth daily. 30 tablet 11 09/23/2022   Magnesium Oxide -Mg Supplement (MAG-OXIDE) 200 MG TABS Take 2 tablets (400 mg total) by mouth at bedtime. If that amount causes loose stools in the am, switch to '200mg'$  daily at bedtime. (Patient not taking: Reported on 09/24/2022) 60 tablet 3 Not Taking   Review of Systems  All systems reviewed and negative  except as stated in HPI  Blood pressure (!) 118/92, pulse 90, temperature 98.8 F (37.1 C), temperature source Oral, resp. rate 18, height '5\' 4"'$  (1.626 m), weight 211 lb 14.4 oz (96.1 kg), SpO2 96 %, unknown if currently breastfeeding. General appearance: alert, cooperative, appears stated age, and no distress Lungs: clear to auscultation bilaterally Heart: regular rate and rhythm Abdomen: soft, non-tender; bowel sounds normal Pelvic: normal external female genitalia Extremities: Homans sign is negative, no sign of DVT DTR's normal Presentation: cephalic Fetal monitoring: Baseline: 125 bpm, Variability: Good {> 6 bpm), Accelerations: Reactive, and Decelerations: Absent Uterine activity: mild and very irregular Dilation: 4 Effacement (%): 60 Station: -3 Exam by:: Candie Chroman CNM  Prenatal labs: ABO, Rh:  --/--/PENDING (10/14 1650) Antibody: PENDING (10/14 1650) Rubella: 2.57 (04/28 1128) RPR: Non Reactive (07/17 1426)  HBsAg: Negative (04/28 1128)  HIV: Non Reactive (07/17 1426)  GBS: Negative/-- (09/13 1433)  1hr GTT: 95 Genetic screening: normal Anatomy U/S: FGR now resolved, short femur length, otherwise normal  Prenatal Transfer Tool  Maternal Diabetes: No Genetic Screening: Normal Maternal Ultrasounds/Referrals: Normal Fetal Ultrasounds or other Referrals:  Referred to Materal Fetal Medicine  Maternal Substance Abuse:  No Significant Maternal Medications:  None Significant Maternal Lab Results:  Group B Strep negative Number of Prenatal Visits:greater than 3 verified prenatal visits Other Comments:  None  Results for orders placed or performed during the hospital encounter of 09/24/22 (from the past 24 hour(s))  POCT fern test   Collection Time: 09/24/22 10:45 AM  Result Value Ref Range   POCT Fern Test Positive = ruptured amniotic membanes   Type and screen [place]   Collection Time: 09/24/22  4:50 PM  Result Value Ref Range   ABO/RH(D) PENDING    Antibody Screen PENDING    Sample Expiration      09/27/2022,2359 Performed at [facility], [address], [place], [place] [address]    Patient Active Problem List   Diagnosis Date Noted   Amniotic fluid leaking 09/24/2022   Fetal growth restriction antepartum 07/26/2022   Vasovagal response 04/08/2022   Supervision of other normal pregnancy, antepartum 03/24/2022   Moderate recurrent major depression ([place]) 02/10/2022   Assessment/Plan:  Paige Michael is a 31 y.o. G4P3003 at 46w3dhere for PROM  #Labor: Latent labor, pt desiring waterbirth and strongly desires natural methods of induction. Encouraged ambulation and position changes to try and get labor started - will consent when in active labor and ready for water. No contraindications at this time. #Pain: Planning waterbirth,  may use nitrous if needed (not while in water) #FWB: Periods of Cat 2 due to minimal reactivity, overall reactive. Encouraged hydration. #ID:  GBS neg #MOF: Breast #MOC: NFP  #Circ:  No  Long conversation about the possible necessity of an IV/IM or SQ injections, pt willing to do them if needed.  JGaylan Gerold CNM, MSN, IPlatte CenterCertified Nurse Midwife, [facility]

## 2022-09-24 NOTE — Progress Notes (Signed)
Patient ID: Paige Michael, female   DOB: 24-Jun-1991, 31 y.o.   MRN: [id number]  RN called to review strip, currently non-reactive due to minimal reactivity with no decels. Pt counseled about need for fluids to try and get baby reactive, without a reactive strip we cannot do a waterbirth. Pt amenable and ROB Paige Shields, RN) called to bedside for IV placement.  Gaylan Gerold, CNM, MSN, [place] Certified Nurse Midwife, [facility]

## 2022-09-24 NOTE — Progress Notes (Signed)

## 2022-09-25 LAB — RPR: RPR Ser Ql: NONREACTIVE

## 2022-09-25 MED ORDER — IBUPROFEN 600 MG PO TABS: 600.0000 mg | ORAL_TABLET | Freq: Four times a day (QID) | ORAL | 0 refills | Status: AC | PRN

## 2022-09-25 NOTE — Lactation Note (Addendum)
This note was copied from a baby's chart. Lactation Consultation Note  Patient Name: Paige Michael [id number] Date: 09/25/2022 Late entry note  Baby 94 hours old P4 exp BF x 3  Reason for consult: Initial assessment;Term (per mom breast feeding is going well and baby . LC encouraged mom to call for latch assessment)    Maternal Data Has patient been taught Hand Expression?: No (per mom familiar with hand expressing) Does the patient have breastfeeding experience prior to this delivery?: Yes How long did the patient breastfeed?: per mom 1st, 2nd and 3rd over 1 year each  Feeding Mother's Current Feeding Choice: Breast Milk  LATCH Score - baby recently fed unable to assess latch at this consult.    Interventions  Education and [facility] resource sheet after delivery   Discharge Pump: Manual (per mom has a hand pump at home. Doesn't usally use a pump.) WIC Program: No  Consult Status Consult Status: Follow-up Date: 09/25/22 Follow-up type: In-patient    [place] 09/25/2022, 1:50 PM

## 2022-09-25 NOTE — Progress Notes (Signed)
Post Partum Day 1 s/p waterbirth Subjective: No complaints, up ad lib, voiding, tolerating PO, and + flatus Baby stable at bedside, breastfeeding  Objective: Blood pressure 117/70, pulse 87, temperature 98.3 F (36.8 C), temperature source Oral, resp. rate 18, height '5\' 4"'$  (1.626 m), weight 96.1 kg, SpO2 98 %, unknown if currently breastfeeding.  Physical Exam:  General: alert and no distress Lochia: appropriate Uterine Fundus: firm DVT Evaluation: No evidence of DVT seen on physical exam. Negative Homan's sign.  No cords or calf tenderness. No significant calf/ankle edema.  Recent Labs    09/24/22 1650  HGB 10.2*  HCT 31.9*    Assessment/Plan: Breastfeeding, doing well NFP for contraception May decide to go home later today if baby can go Routine postpartum care    LOS: 1 day   Verita Schneiders, MD 09/25/2022, 7:37 AM

## 2022-09-25 NOTE — Progress Notes (Addendum)
CSW received consult for hx of Depression. CSW met with Paige Michael at bedside to offer support and complete assessment.  When CSW entered room, Paige Michael was observed bonding with infant in carrier. CSW introduced self and explained reason for consult. Paige Michael was welcoming and presented with a bright affect. Paige Michael remained engaged during consult.  Paige Michael reports since giving birth, she has felt "great", sharing that she feels confident about her ability to navigate the baby blues period postpartum. Paige Michael reports she experienced postpartum depression after her second child in 2016. Paige Michael reports during this time, she was prescribed antidepressants which she took for about 6 weeks but did not like the way they made her feel. Paige Michael reports she was living in [place] and felt isolated due to being far from her supports and her husband was away a lot due to work. Paige Michael reports her situation is completely different than when she experienced PPD in the past, adding that she currently has a strong support system, financial stability, and FOB is able to take paternity leave to help her adjust postpartum and transition to work from home. Paige Michael reports she is aware of PMADs warning signs. CSW commended Paige Michael for her insight and strong support network.  Paige Michael denies depression symptoms since [place] back to [place] in [place] and denied PMADs after giving birth to her 3rd child. Paige Michael reports she attended therapy about 3 years ago but is not current with a therapist. Paige Michael shares she attempted suicide 6 1/2 years ago but denies endorsing SI since this time. Paige Michael denies current SI/HI/DV.   Paige Michael reports she has all needed items for infant, including a car seat and bassinet. Paige Michael has chosen [facility] in [place] for infant follow up care. Paige Michael denied additional resource needs at this time.   CSW provided education regarding the baby blues period vs. perinatal mood disorders, discussed treatment and gave resources for mental health follow up if concerns arise.  CSW  recommends self-evaluation during the postpartum time period using the New Mom Checklist from Postpartum Progress and encouraged Paige Michael to contact a medical professional if symptoms are noted at any time.    CSW declined review of Sudden Infant Death Syndrome (SIDS) precautions, reporting she is educated about safe sleep practices.  CSW identifies no further need for intervention and no barriers to discharge at this time.  Signed,  Everrett Lacasse K. Mystery Schrupp, MSW, LCSWA, LCASA 09/25/2022 4:08 PM 

## 2022-09-25 NOTE — Plan of Care (Signed)
Patient completed & met all education & ready for discharge.

## 2022-09-28 ENCOUNTER — Other Ambulatory Visit: Payer: No Typology Code available for payment source

## 2022-09-28 ENCOUNTER — Encounter: Payer: No Typology Code available for payment source | Admitting: Certified Nurse Midwife

## 2022-09-28 LAB — TYPE AND SCREEN
ABO/RH(D): A POS
Antibody Screen: POSITIVE
Donor AG Type: NEGATIVE
Donor AG Type: NEGATIVE
Unit division: 0
Unit division: 0

## 2022-09-28 LAB — BPAM RBC
Blood Product Expiration Date: 202311142359
Blood Product Expiration Date: 202311142359
Unit Type and Rh: 6200
Unit Type and Rh: 6200

## 2022-10-05 ENCOUNTER — Telehealth (HOSPITAL_COMMUNITY): Payer: Self-pay | Admitting: *Deleted

## 2022-10-05 NOTE — Telephone Encounter (Signed)
Voicemail not setup. Unable to leave message.  Odis Hollingshead, RN 10-05-2022 at 9:26am

## 2022-10-28 ENCOUNTER — Telehealth: Payer: Self-pay

## 2022-10-28 NOTE — Telephone Encounter (Signed)
RN was transferred call from front desk. Pt is complaining of severe nausea and vomiting as well as abdominal cramps. Pt has uncomplicated SVD 04/59. Pt denies fever, chills, headache, vision changes or breast changes. Pt states she tried to take tylenol for the pain this morning but vomited immediately after. RN reviewed symptoms with Lamount Cohen MD. He advised pt be seen in the MAU for further evaluation. Pt agreeable with plan of care and denied further questions.

## 2022-10-30 ENCOUNTER — Other Ambulatory Visit: Payer: Self-pay

## 2022-10-30 ENCOUNTER — Encounter (HOSPITAL_COMMUNITY): Payer: Self-pay

## 2022-10-30 ENCOUNTER — Inpatient Hospital Stay (HOSPITAL_COMMUNITY)
Admission: AD | Admit: 2022-10-30 | Discharge: 2022-10-30 | Disposition: A | Discharge status: Home / Self Care | Payer: Medicaid Other | Attending: Obstetrics & Gynecology | Admitting: Obstetrics & Gynecology

## 2022-10-30 ENCOUNTER — Inpatient Hospital Stay (HOSPITAL_COMMUNITY): Payer: Medicaid Other

## 2022-10-30 DIAGNOSIS — R1032 Left lower quadrant pain: Secondary | ICD-10-CM | POA: Insufficient documentation

## 2022-10-30 DIAGNOSIS — M549 Dorsalgia, unspecified: Secondary | ICD-10-CM | POA: Insufficient documentation

## 2022-10-30 DIAGNOSIS — M545 Low back pain, unspecified: Secondary | ICD-10-CM

## 2022-10-30 LAB — COMPREHENSIVE METABOLIC PANEL
ALT: 17 U/L (ref 0–44)
AST: 20 U/L (ref 15–41)
Albumin: 3 g/dL — ABNORMAL LOW (ref 3.5–5.0)
Alkaline Phosphatase: 85 U/L (ref 38–126)
Anion gap: 12 (ref 5–15)
BUN: 16 mg/dL (ref 6–20)
CO2: 20 mmol/L — ABNORMAL LOW (ref 22–32)
Calcium: 8.9 mg/dL (ref 8.9–10.3)
Chloride: 107 mmol/L (ref 98–111)
Creatinine, Ser: 0.87 mg/dL (ref 0.44–1.00)
GFR, Estimated: >60 mL/min (ref >60)
Glucose, Bld: 93 mg/dL (ref 70–99)
Potassium: 4 mmol/L (ref 3.5–5.1)
Sodium: 139 mmol/L (ref 135–145)
Total Bilirubin: 0.6 mg/dL (ref 0.3–1.2)
Total Protein: 6.9 g/dL (ref 6.5–8.1)

## 2022-10-30 LAB — URINALYSIS, ROUTINE W REFLEX MICROSCOPIC
Bacteria, UA: NONE SEEN
Bilirubin Urine: NEGATIVE
Glucose, UA: NEGATIVE mg/dL
Ketones, ur: 5 mg/dL — AB
Leukocytes,Ua: NEGATIVE
Nitrite: NEGATIVE
Protein, ur: NEGATIVE mg/dL
RBC / HPF: >50 RBC/hpf — ABNORMAL HIGH (ref 0–5)
Specific Gravity, Urine: 1.014 (ref 1.005–1.030)
pH: 5 (ref 5.0–8.0)

## 2022-10-30 LAB — CBC WITH DIFFERENTIAL/PLATELET
Abs Immature Granulocytes: 0.01 10*3/uL (ref 0.00–0.07)
Basophils Absolute: 0.1 10*3/uL (ref 0.0–0.1)
Basophils Relative: 1 %
Eosinophils Absolute: 0.1 10*3/uL (ref 0.0–0.5)
Eosinophils Relative: 1 %
HCT: 38.8 % (ref 36.0–46.0)
Hemoglobin: 11.7 g/dL — ABNORMAL LOW (ref 12.0–15.0)
Immature Granulocytes: 0 %
Lymphocytes Relative: 27 %
Lymphs Abs: 1.8 10*3/uL (ref 0.7–4.0)
MCH: 25.7 pg — ABNORMAL LOW (ref 26.0–34.0)
MCHC: 30.2 g/dL (ref 30.0–36.0)
MCV: 85.1 fL (ref 80.0–100.0)
Monocytes Absolute: 0.6 10*3/uL (ref 0.1–1.0)
Monocytes Relative: 9 %
Neutro Abs: 4.2 10*3/uL (ref 1.7–7.7)
Neutrophils Relative %: 62 %
Platelets: 339 10*3/uL (ref 150–400)
RBC: 4.56 MIL/uL (ref 3.87–5.11)
RDW: 17 % — ABNORMAL HIGH (ref 11.5–15.5)
WBC: 6.7 10*3/uL (ref 4.0–10.5)
nRBC: 0 % (ref 0.0–0.2)

## 2022-10-30 LAB — LIPASE, BLOOD: Lipase: 35 U/L (ref 11–51)

## 2022-10-30 LAB — I-STAT BETA HCG BLOOD, ED (MC, WL, AP ONLY): I-stat hCG, quantitative: <5 m[IU]/mL (ref <5)

## 2022-10-30 MED ORDER — ACETAMINOPHEN 325 MG PO TABS
650.0000 mg | ORAL_TABLET | Freq: Once | ORAL | Status: AC
  Administered 2022-10-30: 650 mg via ORAL
  Filled 2022-10-30: qty 2

## 2022-10-30 NOTE — MAU Provider Note (Addendum)
History     CSN: [id number]  Arrival date and time: 10/30/22 4098   Event Date/Time   First Provider Initiated Contact with Patient 10/30/22 1010      Chief Complaint  Patient presents with   Abdominal Pain   HPI Paige Michael is a 31 y.o. [id number] s/p vaginal birth on 09/24/2022. She presents to MAU from [facility] with chief complaint of LLQ abdominal pain and left back pain. Pain is "sharp" and 10/10 then subsides without intervention. Pain first occurred Friday 10/27/2022. She has questions about kidney stones. She denies urinary complaints, abdominal tenderness, fever. No sick contacts. She denies constipation.  Patient also reports nausea/vomiting, occasional and infrequent. She is not otherwise having difficulty tolerating PO intake.   Patient endorses recently restarting no-impact exercise including 40 minutes on a recumbent bicycle six days per week and gentle pelvic floor mat work with a Physiological scientist.   Patient is exclusively breastfeeding. She has no concerns for mastitis.  Patient receives care with [facility] and is scheduled for her postpartum visit on 11/02/2022.  OB History     Gravida  4   Para  4   Term  4   Preterm  0   AB  0   Living  4      SAB  0   IAB  0   Ectopic  0   Multiple  0   Live Births  4           Past Medical History:  Diagnosis Date   Depression     Past Surgical History:  Procedure Laterality Date   Pyloric stenosis  18   WISDOM TOOTH EXTRACTION  2009    Family History  Adopted: Yes  Problem Relation Age of Onset   Drug abuse Mother    Alcohol abuse Mother    Drug abuse Father    Alcohol abuse Father     Social History   Tobacco Use   Smoking status: Never   Smokeless tobacco: Never  Vaping Use   Vaping Use: Never used  Substance Use Topics   Alcohol use: Never   Drug use: Never    Allergies: No Known Allergies  Medications Prior to Admission  Medication Sig Dispense Refill Last Dose    ibuprofen (ADVIL) 600 MG tablet Take 1 tablet (600 mg total) by mouth every 6 (six) hours as needed. 30 tablet 0    Prenatal Vit-Fe Fumarate-FA (PREPLUS) 27-1 MG TABS Take 1 tablet by mouth daily. 30 tablet 11     Review of Systems  Gastrointestinal:  Positive for abdominal pain.  Musculoskeletal:  Positive for back pain.  All other systems reviewed and are negative.  Physical Exam   Blood pressure 114/73, pulse (!) 59, temperature 98 F (36.7 C), temperature source Oral, resp. rate 19, height '5\' 4"'$  (1.626 m), weight 79.4 kg, SpO2 99 %, unknown if currently breastfeeding.  Physical Exam Vitals and nursing note reviewed. Exam conducted with a chaperone present.  Constitutional:      Appearance: She is not ill-appearing.  Cardiovascular:     Rate and Rhythm: Normal rate and regular rhythm.     Heart sounds: Normal heart sounds.  Pulmonary:     Effort: Pulmonary effort is normal.     Breath sounds: Normal breath sounds.  Abdominal:     General: Bowel sounds are normal.     Palpations: Abdomen is soft.     Tenderness: There is no abdominal tenderness. There is left CVA  tenderness. There is no right CVA tenderness.  Skin:    Capillary Refill: Capillary refill takes less than 2 seconds.  Neurological:     Mental Status: She is alert and oriented to person, place, and time.  Psychiatric:        Mood and Affect: Mood normal.        Behavior: Behavior normal.    MAU Course  Procedures  MDM  --Patient's partner with questions regarding abdominal CT. Discussed that she does not currently have an indication for that procedure. Pain previously responsive to Tylenol and no sign of infection. If she returns to MAU with same level of acuity she may not be offered a CT at that time, pending provider assessment during that encounter  Orders Placed This Encounter  Procedures   US RENAL   CBC with Differential   Comprehensive metabolic panel   Lipase, blood   Urinalysis, Routine w  reflex microscopic   I-Stat beta hCG blood, ED   Insert peripheral IV   Patient Vitals for the past 24 hrs:  BP Temp Temp src Pulse Resp SpO2 Height Weight  10/30/22 1041 114/73 98 F (36.7 C) Oral (!) 59 19 99 % -- --  10/30/22 0836 -- -- -- -- -- -- '5\' 4"'$  (1.626 m) 79.4 kg  10/30/22 0826 119/86 97.9 F (36.6 C) Oral 63 (!) 22 96 % -- --   Results for orders placed or performed during the hospital encounter of 10/30/22 (from the past 24 hour(s))  CBC with Differential     Status: Abnormal   Collection Time: 10/30/22  8:50 AM  Result Value Ref Range   WBC 6.7 4.0 - 10.5 K/uL   RBC 4.56 3.87 - 5.11 MIL/uL   Hemoglobin 11.7 (L) 12.0 - 15.0 g/dL   HCT 38.8 36.0 - 46.0 %   MCV 85.1 80.0 - 100.0 fL   MCH 25.7 (L) 26.0 - 34.0 pg   MCHC 30.2 30.0 - 36.0 g/dL   RDW 17.0 (H) 11.5 - 15.5 %   Platelets 339 150 - 400 K/uL   nRBC 0.0 0.0 - 0.2 %   Neutrophils Relative % 62 %   Neutro Abs 4.2 1.7 - 7.7 K/uL   Lymphocytes Relative 27 %   Lymphs Abs 1.8 0.7 - 4.0 K/uL   Monocytes Relative 9 %   Monocytes Absolute 0.6 0.1 - 1.0 K/uL   Eosinophils Relative 1 %   Eosinophils Absolute 0.1 0.0 - 0.5 K/uL   Basophils Relative 1 %   Basophils Absolute 0.1 0.0 - 0.1 K/uL   Immature Granulocytes 0 %   Abs Immature Granulocytes 0.01 0.00 - 0.07 K/uL  Comprehensive metabolic panel     Status: Abnormal   Collection Time: 10/30/22  8:50 AM  Result Value Ref Range   Sodium 139 135 - 145 mmol/L   Potassium 4.0 3.5 - 5.1 mmol/L   Chloride 107 98 - 111 mmol/L   CO2 20 (L) 22 - 32 mmol/L   Glucose, Bld 93 70 - 99 mg/dL   BUN 16 6 - 20 mg/dL   Creatinine, Ser 0.87 0.44 - 1.00 mg/dL   Calcium 8.9 8.9 - 10.3 mg/dL   Total Protein 6.9 6.5 - 8.1 g/dL   Albumin 3.0 (L) 3.5 - 5.0 g/dL   AST 20 15 - 41 U/L   ALT 17 0 - 44 U/L   Alkaline Phosphatase 85 38 - 126 U/L   Total Bilirubin 0.6 0.3 - 1.2 mg/dL  GFR, Estimated >60 >60 mL/min   Anion gap 12 5 - 15  Lipase, blood     Status: None   Collection  Time: 10/30/22  8:50 AM  Result Value Ref Range   Lipase 35 11 - 51 U/L  I-Stat beta hCG blood, ED     Status: None   Collection Time: 10/30/22  9:14 AM  Result Value Ref Range   I-stat hCG, quantitative <5.0 <5 mIU/mL   Comment 3            US RENAL  Result Date: 10/30/2022 CLINICAL DATA:  Acute left flank pain.  Recent pregnancy. EXAM: RENAL / URINARY TRACT ULTRASOUND COMPLETE COMPARISON:  Recent pregnancy FINDINGS: Right Kidney: Renal measurements: 9.8 x 5.3 x 5.2 cm = volume: 143.6 mL. Echogenicity within normal limits. No mass or hydronephrosis visualized. Left Kidney: Renal measurements: 12.4 x 5.9 x 5.8 cm = volume: 225.4 mL. Mild to moderate left-sided hydronephrosis is present. No stone or mass lesion is present. No obstructing lesion is evident. Bladder: Appears normal for degree of bladder distention. Bladder is mostly collapsed. IMPRESSION: 1. Mild to moderate left-sided hydronephrosis. No obstructing lesion is evident. 2. Normal appearance of the right kidney. Electronically Signed   By: San Morelle M.D.   On: 10/30/2022 12:02     Meds ordered this encounter  Medications   acetaminophen (TYLENOL) tablet 650 mg   Assessment and Plan  --31 y.o. [id number] s/p vaginal birth 5w 1d ago --New onset abdominal pain --No evidence of acute or infectious process on today's evaluation --Advised to restart pain medication on scheduled administration --Discharge home in stable condition  F/U: --Patient has postpartum visit on 11/02/2022 at [facility] --Consider Amb referral to PT if persistent MSK pain without associated acute process  Darlina Rumpf, [place], MSN, CNM 10/30/2022, 1:32 PM

## 2022-10-30 NOTE — ED Provider Triage Note (Signed)
Emergency Medicine Provider Triage Evaluation Note  Paige Michael , a 31 y.o. female  was evaluated in triage.  Pt complains of intermittent periods of sharp left sided abdominal pain, followed by periods of no symptoms at all.  First occurred Friday, lasted about 2 hours then relieved on own.  Then occurred Saturday in same manner.  Now started again around 3 hours ago.  With N/V.  Denies fever, urinary symptoms, vaginal symptoms, or changes in bowel habits.  Pt is 5 weeks post partum.  Review of Systems  Positive:  Negative: See above  Physical Exam  BP 119/86   Pulse 63   Temp 97.9 F (36.6 C) (Oral)   Resp (!) 22   Ht '5\' 4"'$  (1.626 m)   Wt 79.4 kg   LMP  (LMP Unknown)   SpO2 96%   BMI 30.04 kg/m  Gen:   Awake, no distress   Resp:  Normal effort  MSK:   Moves extremities without difficulty  Other:  Abdomen soft, left mid-abdominal tenderness.  Some left flank tenderness.  Appears uncomfortable.  Medical Decision Making  Medically screening exam initiated at 8:49 AM.  Appropriate orders placed.  Cyril Loosen Cannaday was informed that the remainder of the evaluation will be completed by another provider, this initial triage assessment does not replace that evaluation, and the importance of remaining in the ED until their evaluation is complete.  Currently breast feeding, zofran deferred at this time.  Contacted MAU and discussed pt with MAU nurse, pt to be seen/evaluated in MAU.  Transport on way.   Prince Rome, [place] 15/[birth year] [phone]

## 2022-10-30 NOTE — MAU Note (Signed)
Paige Michael is a 31 y.o. in MAU reporting: 5wk post vag delivery.  C/o pain in abd, mid left around to back, waves of intensity.  Up to a 9 at time, comes and goes. (As a comparison for pain, pt has delivered unmedicated x4).    Onset of complaint: Friday Pain score: 3 currently, up to a 9 Vitals:   10/30/22 0826 10/30/22 1041  BP: 119/86 114/73  Pulse: 63 (!) 59  Resp: (!) 22 19  Temp: 97.9 F (36.6 C) 98 F (36.7 C)  SpO2: 96% 99%      Lab orders placed from triage:  Plan addressed.  Need for urine, waiting on [place]

## 2022-10-30 NOTE — ED Triage Notes (Addendum)
Pt arrived POV from home c/o generalized abdominal pain that has been intermittent since Friday but starts on the left and moves across her belly, pt also endorses N/V. Pt is 5 weeks postpartum.

## 2022-11-02 ENCOUNTER — Encounter: Payer: Self-pay | Admitting: Certified Nurse Midwife

## 2022-11-02 ENCOUNTER — Ambulatory Visit (INDEPENDENT_AMBULATORY_CARE_PROVIDER_SITE_OTHER): Payer: Medicaid Other | Admitting: Certified Nurse Midwife

## 2022-11-02 ENCOUNTER — Other Ambulatory Visit: Payer: Self-pay

## 2022-11-02 DIAGNOSIS — Z8659 Personal history of other mental and behavioral disorders: Secondary | ICD-10-CM

## 2022-11-02 DIAGNOSIS — N200 Calculus of kidney: Secondary | ICD-10-CM

## 2022-11-02 DIAGNOSIS — F331 Major depressive disorder, recurrent, moderate: Secondary | ICD-10-CM | POA: Diagnosis not present

## 2022-11-02 MED ORDER — BUPROPION HCL ER (XL) 150 MG PO TB24: 150.0000 mg | ORAL_TABLET | Freq: Every day | ORAL | 11 refills | Status: DC

## 2022-11-02 MED ORDER — TAMSULOSIN HCL 0.4 MG PO CAPS: 0.4000 mg | ORAL_CAPSULE | Freq: Every day | ORAL | 0 refills | Status: AC

## 2022-11-02 MED ORDER — BUPROPION HCL ER (XL) 150 MG PO TB24: 150.0000 mg | ORAL_TABLET | Freq: Every day | ORAL | 5 refills | Status: DC

## 2022-11-02 NOTE — Progress Notes (Unsigned)
Post Partum Visit Note  Paige Michael is a 31 y.o. [phone] female who presents for a postpartum visit. She is 5 weeks 4 days postpartum following a normal spontaneous vaginal delivery.  I have fully reviewed the prenatal and intrapartum course. The delivery was at 40 weeks 3 days.  Anesthesia: none. Postpartum course has been uncomplicated physically, quite complicated by marital stress.In addition, she has recently experienced severe, sudden and sharp left flank pain that radiates into her abdomen and causes bouts of nausea/vomiting. Has been afebrile and was evaluated in MAU on 10/30/22 but told it was musculoskeletal pain. Baby is doing well. Baby is feeding by breast. Bleeding no bleeding. Bowel function is normal. Bladder function is normal. Patient is not sexually active. Contraception method is  Herbalist . Postpartum depression screening: positive.  Health Maintenance Due  Topic Date Due   INFLUENZA VACCINE  Never done   COVID-19 Vaccine (3 - 2023-24 season) 08/12/2022    The following portions of the patient's history were reviewed and updated as appropriate: allergies, current medications, past family history, past medical history, past social history, past surgical history, and problem list.  Review of Systems Pertinent items noted in HPI and remainder of comprehensive ROS otherwise negative.  Objective:  BP 106/65   Pulse (!) 59   Wt 180 lb 8 oz (81.9 kg)   Breastfeeding Yes   BMI 30.98 kg/m    Constitutional: Alert, oriented female in no physical distress.  HEENT: PERRLA Skin: normal color and turgor, no rash Cardiovascular: normal rate & rhythm, warm and well perfused Respiratory: normal effort, no problems with respiration noted GI: Abd soft, non-tender MS: Extremities nontender, no edema, normal ROM Neurologic: Alert and oriented x 4.  GU: left CVA tenderness Pelvic: exam deferred, no complaints of discomfort Breasts: normal lactating  breasts  Assessment:  1. Postpartum care and examination - Healing well physically aside from apparent kidney stone  2. Moderate recurrent major depression - Marital infidelity causing a recurrence of major depression with some passive suicidal ideation. Pt has been on a SSRI before but did not feel like it helped, her sister has done well on Wellbutrin, she'd like to try it. - Aware of community resources, will reach out if meds not helping.  3. Kidney stone - Reviewed presentation and test results from MAU with Dr. Dione Plover who agreed pt likely has a kidney stone unable to be seen on u/s. Flomax prescribed, pt instructed to take ibuprofen and flomax to help pass stone. If bouts of flank pain do not get better or she becomes febrile, she will reach out for CT, pyelo treatment and/or referral to urology.  Plan:   Essential components of care per ACOG recommendations:  1.  Mood and well being: Patient with positive depression screening today. Reviewed local resources for support.  - Patient tobacco use? No.   - hx of drug use? No.    2. Infant care and feeding:  -Patient currently breastmilk feeding? Yes. Reviewed importance of draining breast regularly to support lactation.  -Social determinants of health (SDOH) reviewed in EPIC. No concerns  3. Sexuality, contraception and birth spacing - Patient does not want a pregnancy in the next year.  Desired family size is 4 children.  - Reviewed reproductive life planning. Reviewed contraceptive methods based on pt preferences and effectiveness.  Patient desired Abstinence today, will practice NFP when ready to begin sexual activity again. Has done this successfully until/between all kids.   -  Discussed birth spacing of 18 months  4. Sleep and fatigue -Encouraged family/partner/community support of 4 hrs of uninterrupted sleep to help with mood and fatigue  5. Physical Recovery  - Discussed patients delivery and complications. She describes  her labor as mixed. - Patient had a Vaginal, no problems at delivery. Patient had a  labial/clitoral hood  laceration. Perineal healing reviewed. Patient expressed understanding - Patient has urinary incontinence? No. - Patient is safe to resume physical and sexual activity when ready  6.  Health Maintenance - HM due items addressed Yes - Last pap smear 04/16/2020, normal. Pap smear not done at today's visit.  -Breast Cancer screening indicated? No.   7. Chronic Disease/Pregnancy Condition follow up: None - PCP follow up as needed - Pt to check in via MyChart message re new Wellbutrin and kidney stone  Gabriel Carina, [place] for [employer], [place]

## 2022-11-03 DIAGNOSIS — N200 Calculus of kidney: Secondary | ICD-10-CM | POA: Insufficient documentation

## 2022-11-07 ENCOUNTER — Encounter: Payer: Self-pay | Admitting: Certified Nurse Midwife

## 2022-11-10 ENCOUNTER — Ambulatory Visit: Payer: Medicaid Other

## 2022-11-24 ENCOUNTER — Encounter: Payer: Self-pay | Admitting: *Deleted

## 2022-11-30 ENCOUNTER — Other Ambulatory Visit: Payer: Self-pay | Admitting: *Deleted

## 2022-11-30 DIAGNOSIS — F331 Major depressive disorder, recurrent, moderate: Secondary | ICD-10-CM

## 2022-11-30 MED ORDER — BUPROPION HCL ER (XL) 150 MG PO TB24: 150.0000 mg | ORAL_TABLET | Freq: Every day | ORAL | 3 refills | Status: AC

## 2022-11-30 NOTE — Progress Notes (Signed)
Fax request for [address] supply of Bupropion received from [facility]. Request approved and return fax to [facility] completed.

## 2023-12-06 IMAGING — US US OB COMP LESS 14 WK
1 series · 15 of 21 positions shown · non-contrast
Comparison: None.

CLINICAL DATA: Uncertain LMP, confirm dating.

EXAM:
OBSTETRIC <14 WK US AND TRANSVAGINAL OB US
TECHNIQUE: Both transabdominal and transvaginal ultrasound examinations were
performed for complete evaluation of the gestation as well as the
maternal uterus, adnexal regions, and pelvic cul-de-sac.
Transvaginal technique was performed to assess early pregnancy.

[Series 1: us ob comp less 14 wk · 21 acquisitions, 15 frames shown]
[im 1/21]
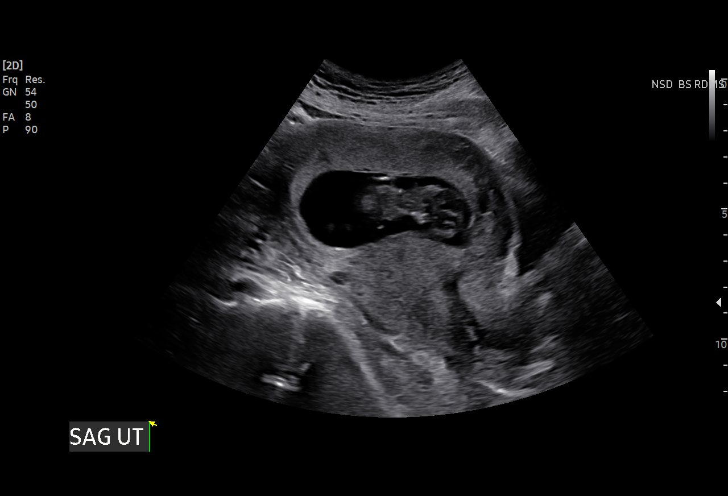
[im 3/21]
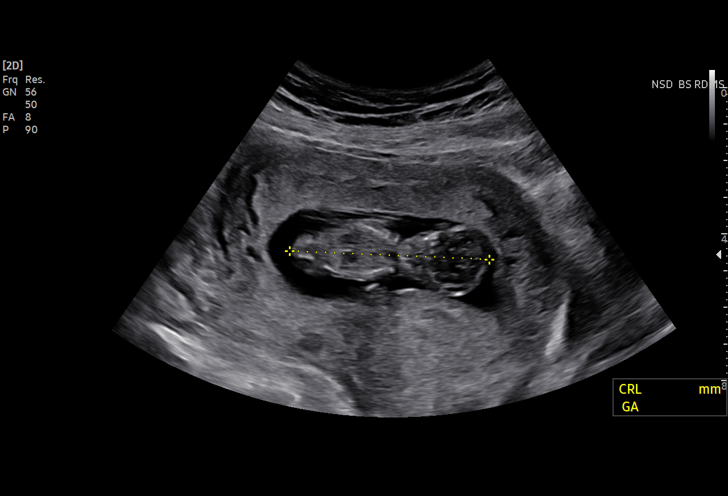
[im 4/21]
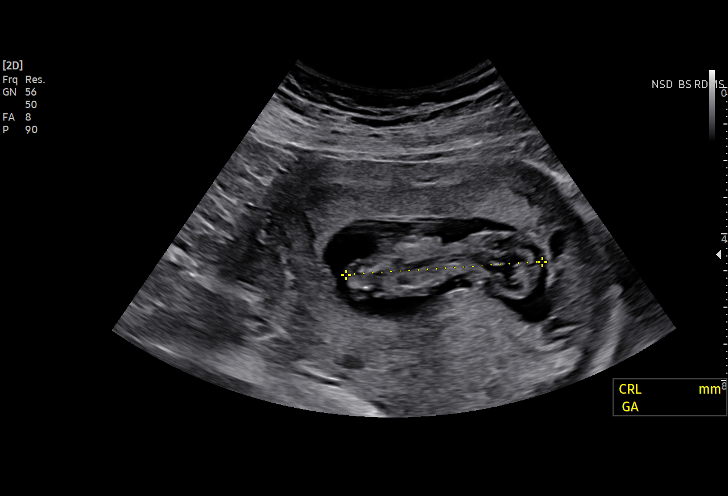
[im 5/21]
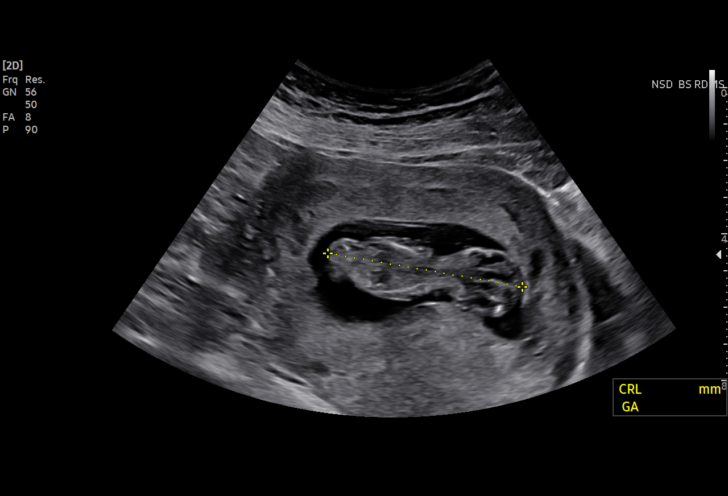
[im 7/21]
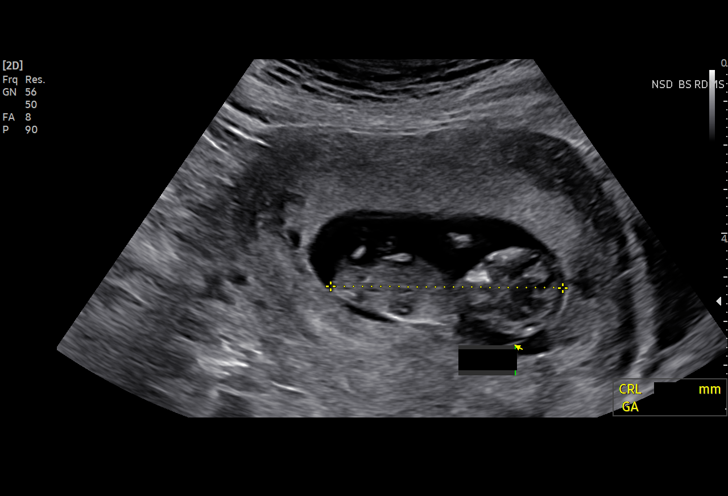
[im 8/21]
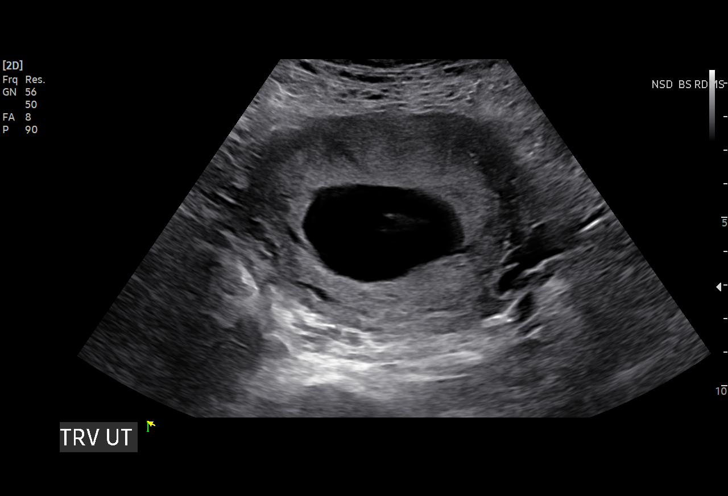
[im 10/21]
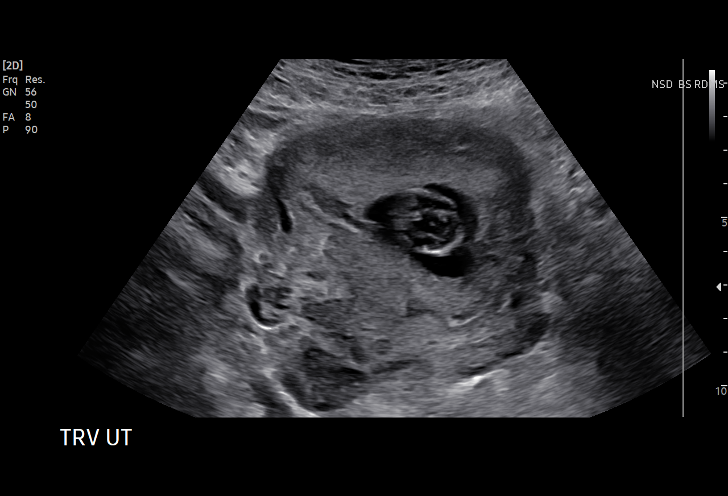
[im 11/21]
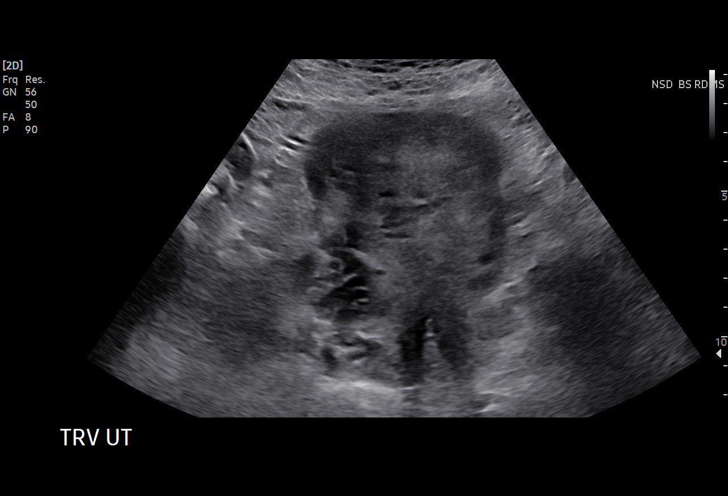
[im 12/21]
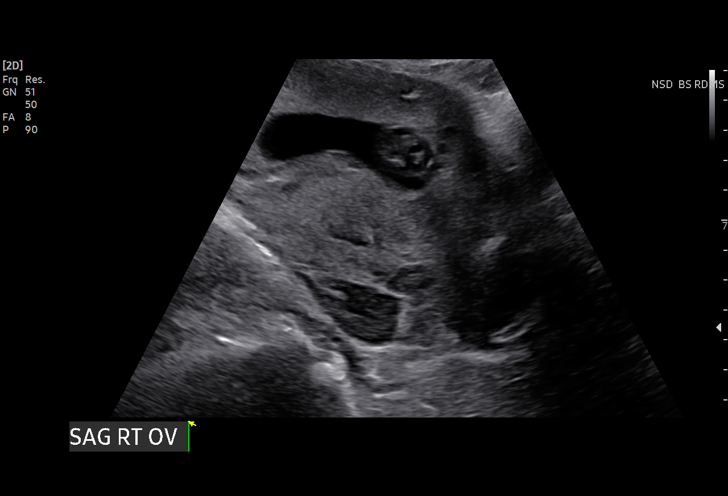
[im 14/21]
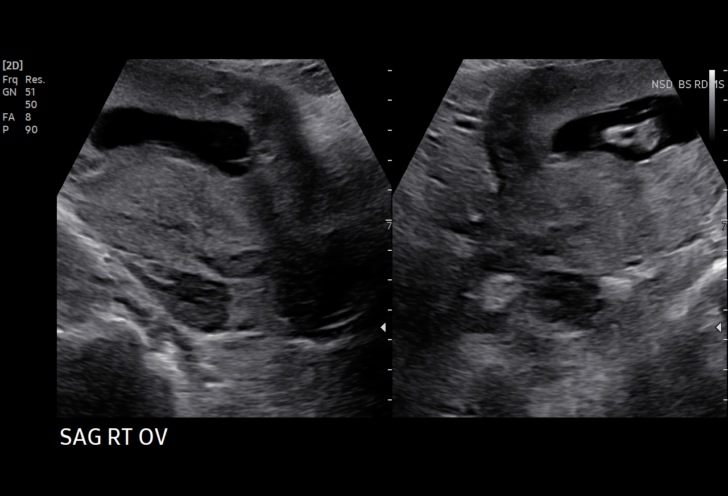
[im 15/21]
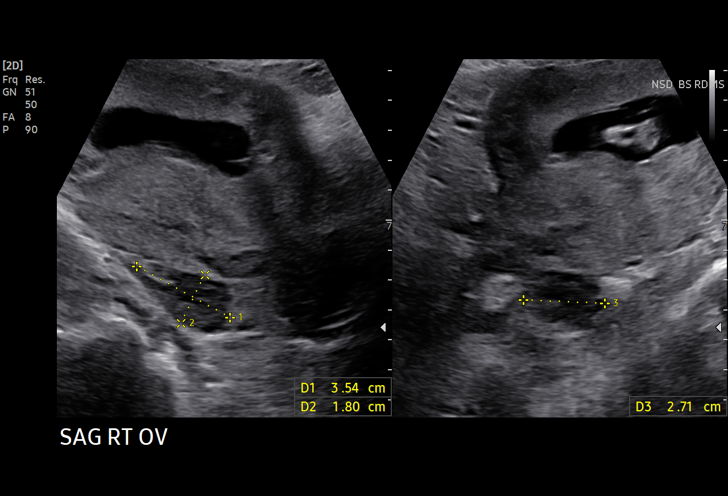
[im 17/21]
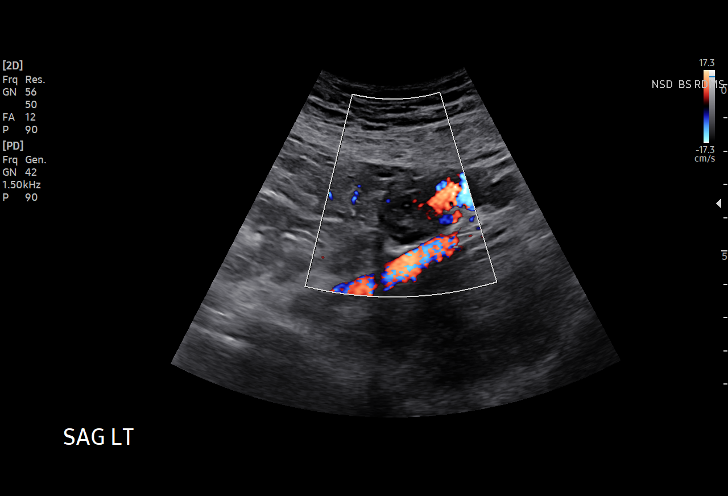
[im 18/21]
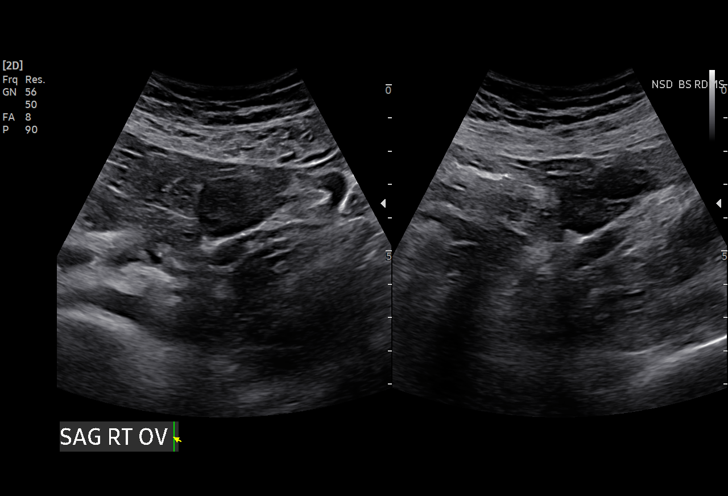
[im 19/21]
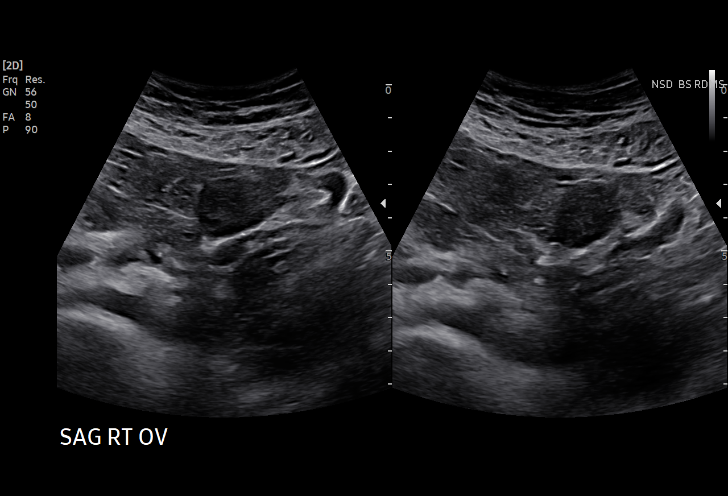
[im 21/21]
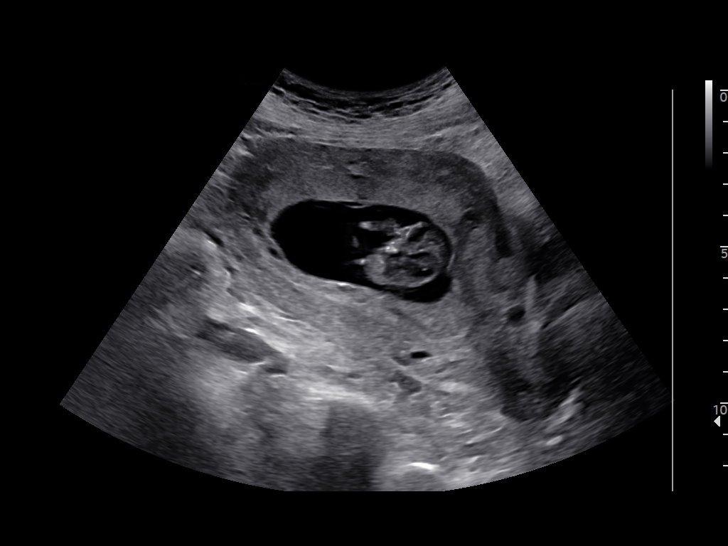

[15 of 21 positions shown; findings below may reference images not displayed]

FINDINGS: Intrauterine gestational sac: Single

Yolk sac:  Not Visualized.

Embryo:  Visualized.

Cardiac Activity: Visualized.

Heart Rate: 169 bpm

CRL:  53.4 mm   12 w   0 d                  US EDC: 09/21/2022

Subchorionic hemorrhage:  None visualized.

Maternal uterus/adnexae: Normal
IMPRESSION: Single live intrauterine gestation with approximate gestational age
of 12 weeks and 0 days based upon crown-rump length.
